# Patient Record
Sex: Male | Born: 1970 | Race: White | Hispanic: No | Smoking: Never smoker
Health system: Southern US, Community
[De-identification: ages and names within clinical notes are randomized; demographics above are authoritative.]

## PROBLEM LIST (undated history)

## (undated) DIAGNOSIS — R030 Elevated blood-pressure reading, without diagnosis of hypertension: Secondary | ICD-10-CM

## (undated) DIAGNOSIS — I493 Ventricular premature depolarization: Secondary | ICD-10-CM

## (undated) HISTORY — DX: Ventricular premature depolarization: I49.3

## (undated) HISTORY — DX: Elevated blood-pressure reading, without diagnosis of hypertension: R03.0

## (undated) HISTORY — PX: NO PAST SURGERIES: SHX2092

---

## 2010-08-31 ENCOUNTER — Emergency Department (HOSPITAL_COMMUNITY): Admission: EM | Admit: 2010-08-31 | Discharge: 2010-09-01 | Payer: Self-pay | Admitting: Emergency Medicine

## 2010-09-03 ENCOUNTER — Encounter (INDEPENDENT_AMBULATORY_CARE_PROVIDER_SITE_OTHER): Payer: Self-pay | Admitting: *Deleted

## 2010-09-03 ENCOUNTER — Emergency Department (HOSPITAL_COMMUNITY): Admission: EM | Admit: 2010-09-03 | Discharge: 2010-09-03 | Payer: Self-pay | Admitting: Emergency Medicine

## 2010-09-21 ENCOUNTER — Encounter (INDEPENDENT_AMBULATORY_CARE_PROVIDER_SITE_OTHER): Payer: Self-pay | Admitting: *Deleted

## 2010-11-06 ENCOUNTER — Ambulatory Visit: Payer: Self-pay | Admitting: Gastroenterology

## 2010-11-06 ENCOUNTER — Encounter (INDEPENDENT_AMBULATORY_CARE_PROVIDER_SITE_OTHER): Payer: Self-pay | Admitting: *Deleted

## 2011-01-29 NOTE — Assessment & Plan Note (Signed)
History of Present Illness Visit Type: Initial Consult Primary GI MD: Rob Bunting MD Primary Provider: Allayne Butcher, MD Requesting Provider: Allayne Butcher, MD Chief Complaint: epigastric pain History of Present Illness:       very pleasant 40 year old man who in september, got home from work, noticed a nagging epigastric pain, dull (about  a 3/10), kept getting more intense.  Felt like he had to pass gas or  belch.  Waxed and waned, he tossed and turned for a couple hours then fell to sleep.   Went to family medicine appt the next day. Felt completely fine.  Thought it was perhaps GERd related, was given omeprazole, take one a day.  Later that same day the pain hit again and so he went to ER at [facility].  They did bloodwork (all normal).  Given some food, feeling bloated, early satiety.  went home, felt indigestion, early satiety (sherbet, crackers, potatos).  Went to work and had the same pain, went back to ER.  Had US done (normal).  Had plain abd films (normal).  Was given a single percocet and that is all he took and has felt completley fine every since then.  He used to take [facility] powders, stopped 5 years.  Still needs ibuprofen about 2 times a month.           Current Medications (verified): 1)  Omeprazole 20 Mg Cpdr (Omeprazole) .... Once Daily  Allergies (verified): No Known Drug Allergies  Past History:  Past Medical History: none  Past Surgical History: none  Family History: no colon cancer, no stomach cancer  Social History: he is single, he has no children, he is an Personnel officer, he does not smoke cigarettes or drink alcohol or caffeine  Review of Systems       Pertinent positive and negative review of systems were noted in the above HPI and GI specific review of systems.  All other review of systems was otherwise negative.   Vital Signs:  Patient profile:   40 year old male Height:      65 inches Weight:      155.13 pounds BMI:     25.91 Pulse rate:   100 /  minute Pulse rhythm:   regular BP sitting:   118 / 70  (left arm) Cuff size:   regular  Vitals Entered By: June McMurray CMA Duncan Dull) (November 06, 2010 10:16 AM)  Physical Exam  Additional Exam:  Constitutional: generally well appearing Psychiatric: alert and oriented times 3 Eyes: extraocular movements intact Mouth: oropharynx moist, no lesions Neck: supple, no lymphadenopathy Cardiovascular: heart regular rate and rythm Lungs: CTA bilaterally Abdomen: soft, non-tender, non-distended, no obvious ascites, no peritoneal signs, normal bowel sounds Extremities: no lower extremity edema bilaterally Skin: no lesions on visible extremities    Impression & Recommendations:  Problem # 1:  Limited dyspepsia CBC, complete metabolic profile, abdominal ultrasound, abdominal plain films were all normal during this episode of dyspepsia about 2 months ago. He has felt completely normal since then. Perhaps he had a viral mild gastroenteritis. Perhaps this was indeed biliary in nature, early or dyskinesia perhaps. He doesn't take a lot of NSAIDs to suggest peptic ulcer disease. Also of note is that he was H. pylori negative serologically.  Given a negative workup to date and no alarm symptoms I think simply observing him clinically is reasonable. He will try to cut backOn his proton pump inhibitor and then stop altogether in about 2 weeks. He knows to call  if he has a return of his symptoms.  Patient Instructions: 1)  Reduce the omeprazole to every other day for two weeks, then stop completely.  Call Dr. Christella Hartigan' in 4-5 weeks to report on your symptoms.   2)  If symptoms return call sooner.  3)  The medication list was reviewed and reconciled.  All changed / newly prescribed medications were explained.  A complete medication list was provided to the patient / caregiver.

## 2011-01-29 NOTE — Letter (Signed)
Summary: Return to Work  [facility]  [address] [place], [place] [address]   Phone: [phone]  Fax: [phone]    11/06/2010  TO: Leodis Sias IT MAY CONCERN  RE: Michael Zavala [address] [name]   The above named individual is under my medical care and may return to work on: 11/06/10    If you have any further questions or need additional information, please call.     Sincerely,      Rob Bunting MD typed by: Chales Abrahams CMA (AAMA)

## 2011-01-29 NOTE — Letter (Signed)
Summary: New Patient letter  [facility]  [address] [place], [place] [address]   Phone: [phone]  Fax: [phone]       09/21/2010 MRN: [id number]  [facility] [address] [place], [place]  [address]  Dear Michael Zavala,  Welcome to the Gastroenterology Division at [facility].    You are scheduled to see Dr.  Christella Hartigan on 11-06-10 at 10:30a.m. on the 3rd floor at [facility], [address]  We ask that you try to arrive at our office 15 minutes prior to your appointment time to allow for check-in.  We would like you to complete the enclosed self-administered evaluation form prior to your visit and bring it with you on the day of your appointment.  We will review it with you.  Also, please bring a complete list of all your medications or, if you prefer, bring the medication bottles and we will list them.  Please bring your insurance card so that we may make a copy of it.  If your insurance requires a referral to see a specialist, please bring your referral form from your primary care physician.  Co-payments are due at the time of your visit and may be paid by cash, check or credit card.     Your office visit will consist of a consult with your physician (includes a physical exam), any laboratory testing he/she may order, scheduling of any necessary diagnostic testing (e.g. x-ray, ultrasound, CT-scan), and scheduling of a procedure (e.g. Endoscopy, Colonoscopy) if required.  Please allow enough time on your schedule to allow for any/all of these possibilities.    If you cannot keep your appointment, please call [phone] to cancel or reschedule prior to your appointment date.  This allows [place] the opportunity to schedule an appointment for another patient in need of care.  If you do not cancel or reschedule by 5 p.m. the business day prior to your appointment date, you will be charged a $50.00 late cancellation/no-show fee.    Thank you for choosing  [facility] for your medical needs.  We appreciate the opportunity to care for you.  Please visit [place] at our website  to learn more about our practice.                     Sincerely,                                                             The Gastroenterology Division

## 2011-03-14 LAB — DIFFERENTIAL
Basophils Absolute: 0 10*3/uL (ref 0.0–0.1)
Eosinophils Relative: 1 % (ref 0–5)
Lymphocytes Relative: 23 % (ref 12–46)
Lymphs Abs: 1.9 10*3/uL (ref 0.7–4.0)
Monocytes Absolute: 0.8 10*3/uL (ref 0.1–1.0)
Neutro Abs: 5.4 10*3/uL (ref 1.7–7.7)

## 2011-03-14 LAB — COMPREHENSIVE METABOLIC PANEL
AST: 19 U/L (ref 0–37)
Albumin: 4.4 g/dL (ref 3.5–5.2)
BUN: 7 mg/dL (ref 6–23)
Calcium: 9.7 mg/dL (ref 8.4–10.5)
Creatinine, Ser: 1.01 mg/dL (ref 0.4–1.5)
GFR calc Af Amer: >60 mL/min (ref >60)
Total Bilirubin: 1 mg/dL (ref 0.3–1.2)
Total Protein: 7.8 g/dL (ref 6.0–8.3)

## 2011-03-14 LAB — CBC
MCH: 31.1 pg (ref 26.0–34.0)
MCHC: 34.3 g/dL (ref 30.0–36.0)
MCV: 90.8 fL (ref 78.0–100.0)
Platelets: 216 10*3/uL (ref 150–400)
RBC: 5.35 MIL/uL (ref 4.22–5.81)
RDW: 12.8 % (ref 11.5–15.5)

## 2011-03-14 LAB — LIPASE, BLOOD: Lipase: 27 U/L (ref 11–59)

## 2011-08-14 IMAGING — CR DG ABDOMEN 2V
2 series · 2 of 2 positions shown · non-contrast
Comparison: None

CLINICAL DATA: Abdominal pain.

ABDOMEN - 2 VIEW

[w abdomen upright]
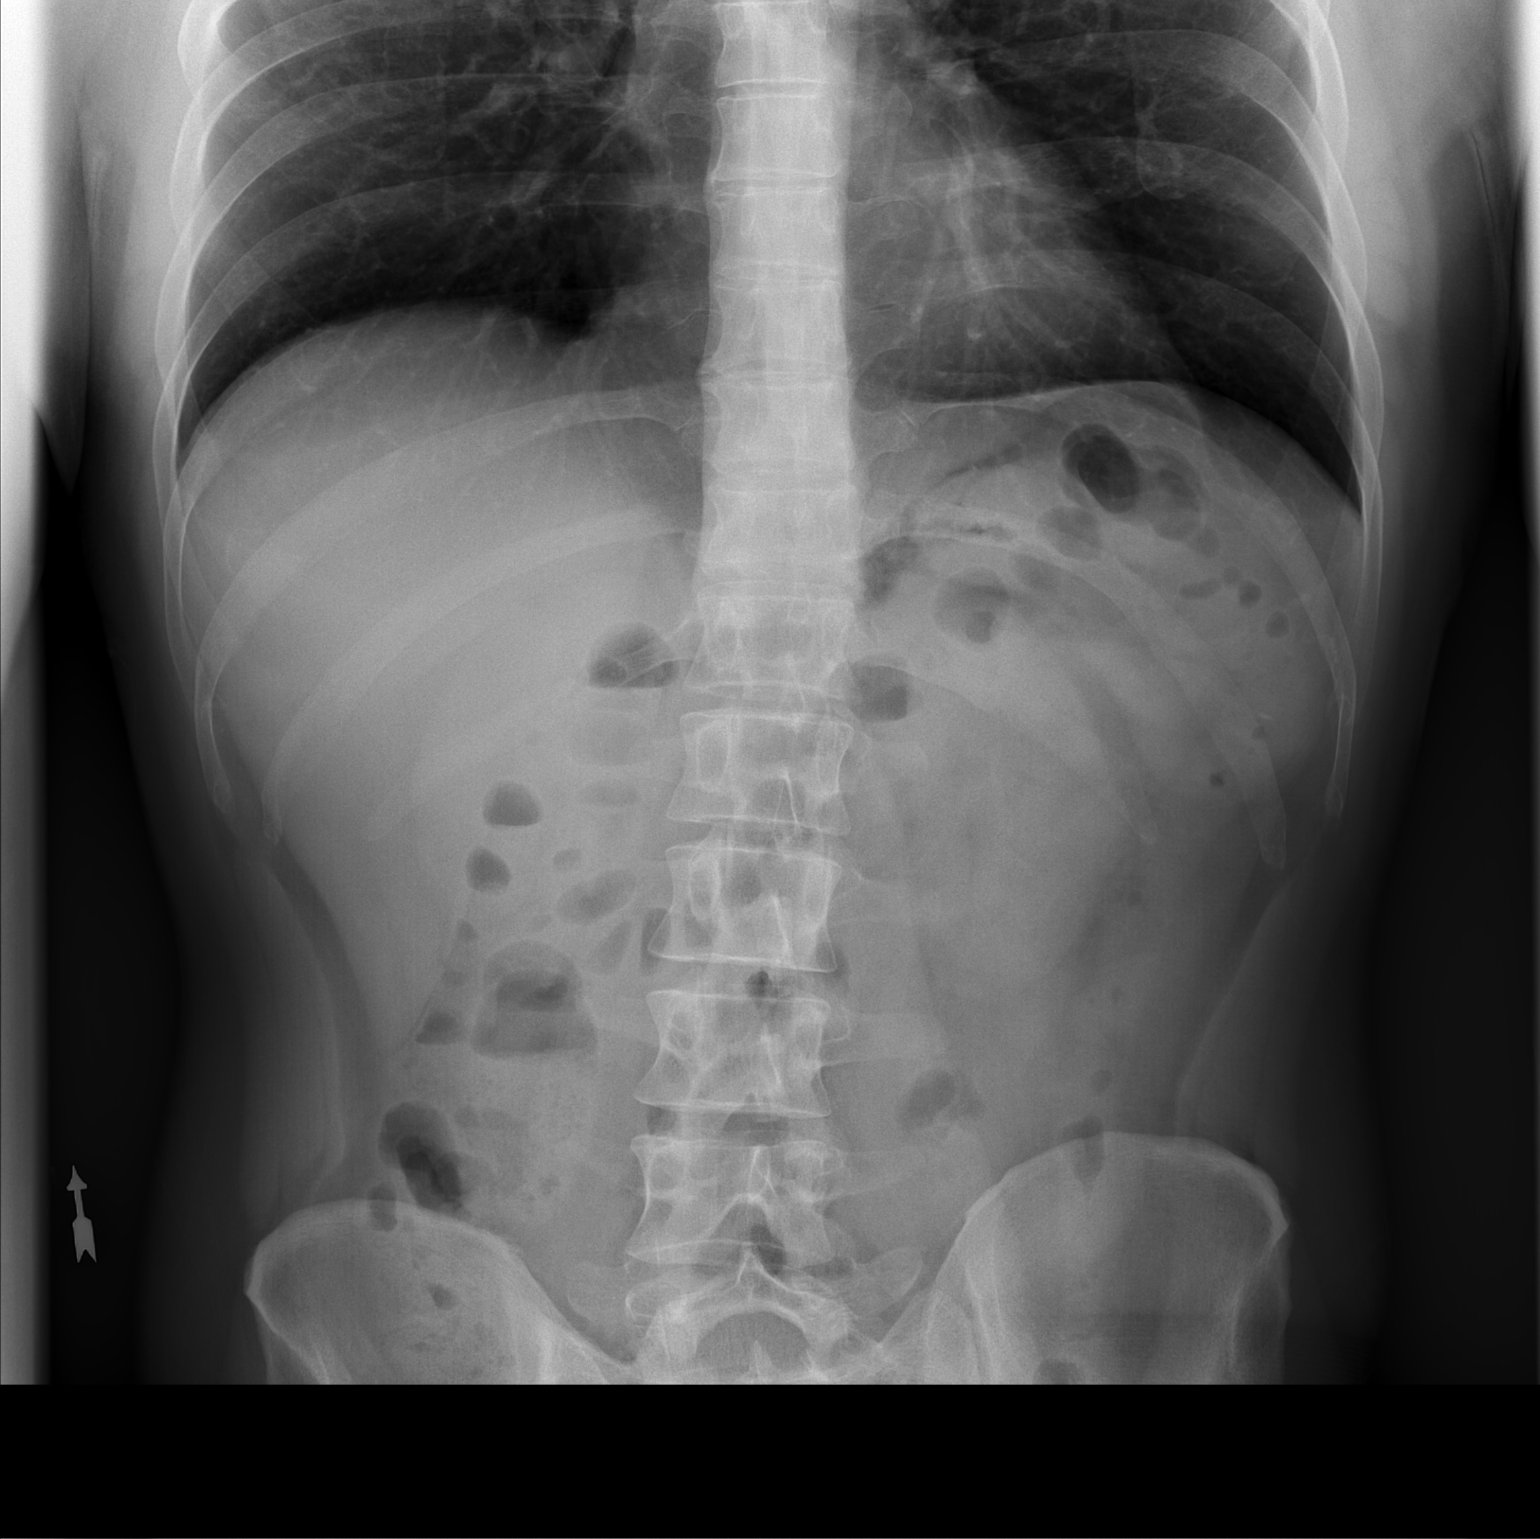

[t abdomen supine]
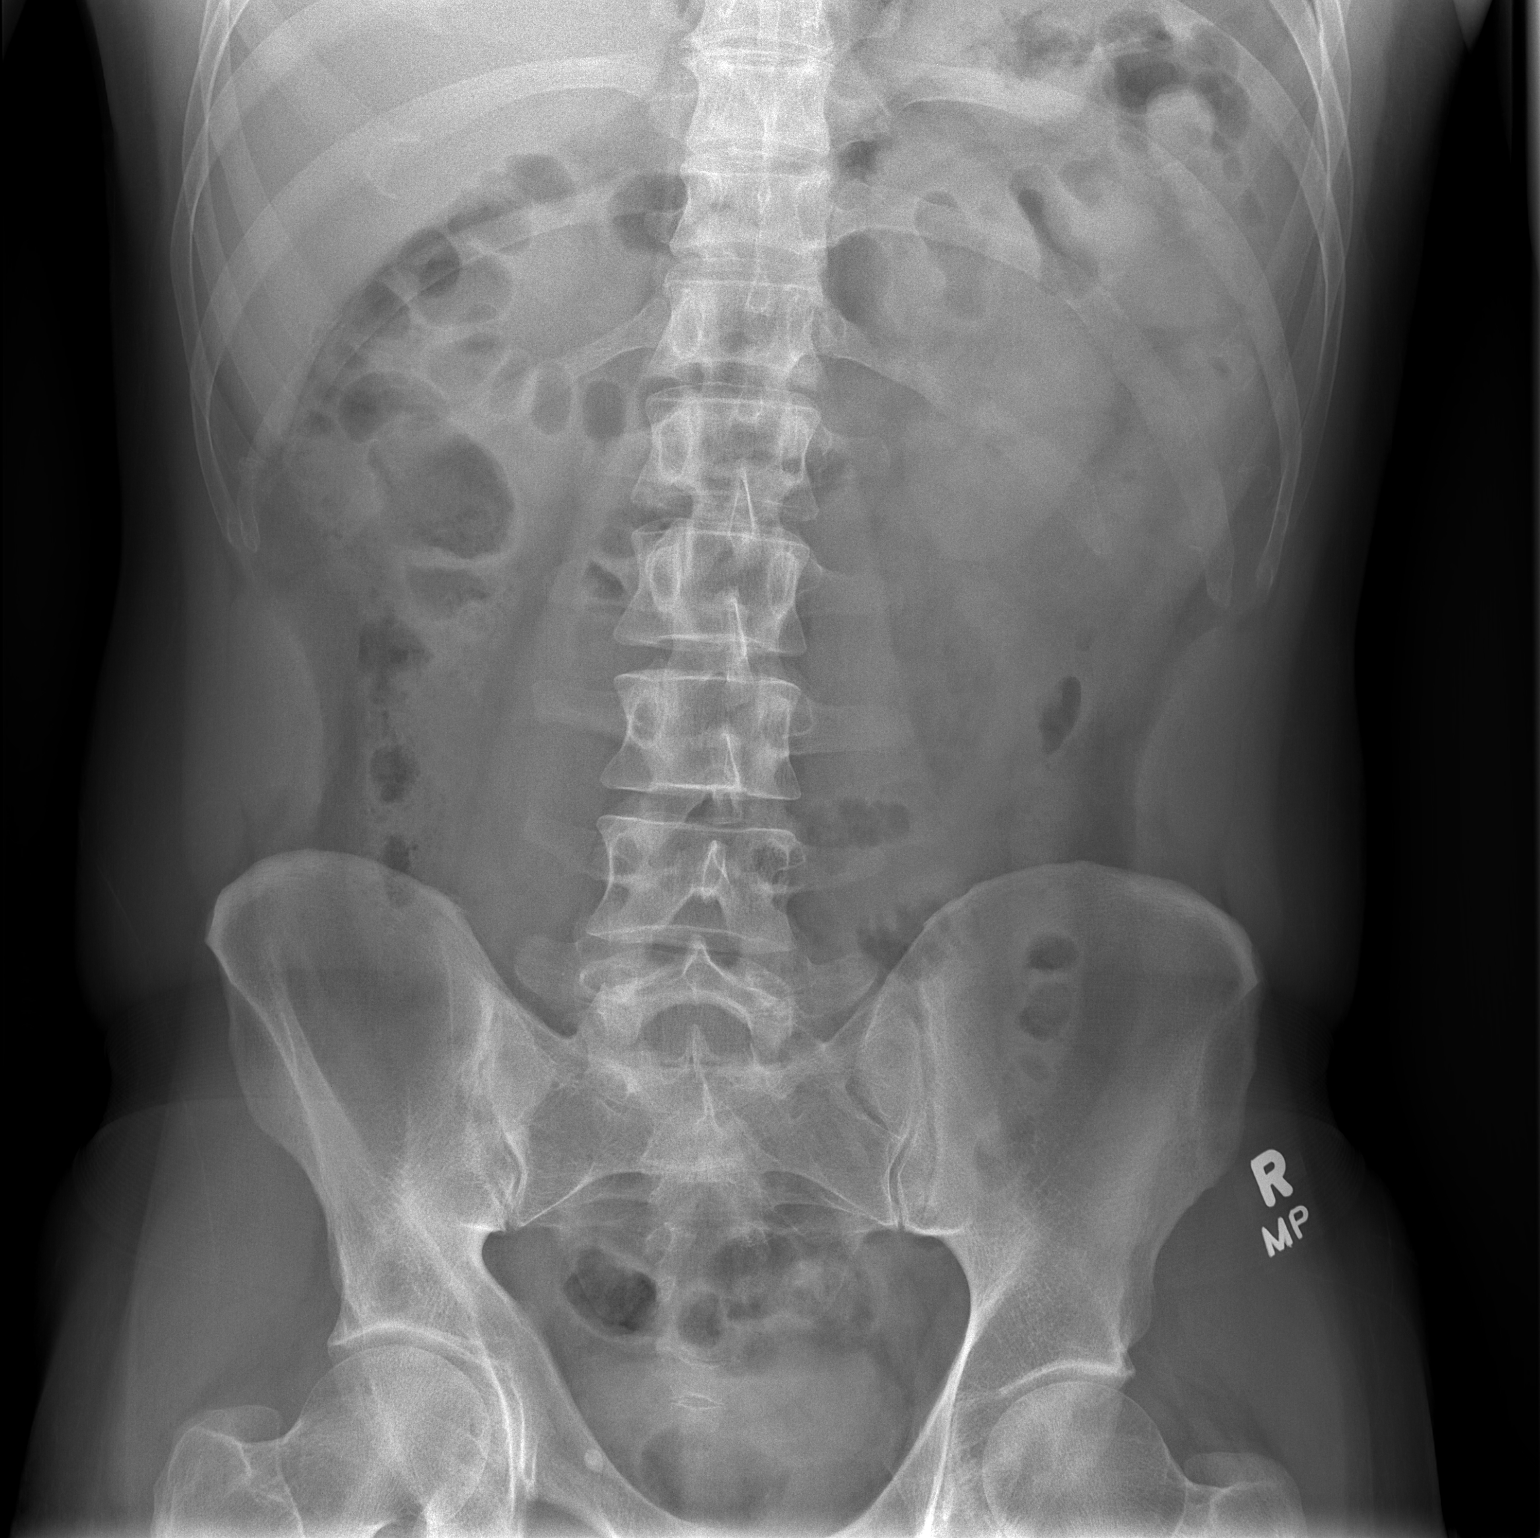

[2 of 2 positions shown; findings below may reference images not displayed]

FINDINGS: Normal bowel gas pattern.  Negative for bowel
obstruction.  Negative for free intraperitoneal gas.  No bony
abnormalities detected.  Negative for renal calculi.
IMPRESSION: Negative

## 2013-06-30 ENCOUNTER — Other Ambulatory Visit: Payer: Self-pay | Admitting: Family Medicine

## 2013-11-03 ENCOUNTER — Other Ambulatory Visit: Payer: 59

## 2013-11-03 DIAGNOSIS — Z0000 Encounter for general adult medical examination without abnormal findings: Secondary | ICD-10-CM

## 2013-11-03 LAB — CBC WITH DIFFERENTIAL/PLATELET
HCT: 46.7 % (ref 39.0–52.0)
Hemoglobin: 15.9 g/dL (ref 13.0–17.0)
Lymphs Abs: 1.8 10*3/uL (ref 0.7–4.0)
MCH: 30.4 pg (ref 26.0–34.0)
MCHC: 34 g/dL (ref 30.0–36.0)
Monocytes Absolute: 0.4 10*3/uL (ref 0.1–1.0)
Monocytes Relative: 9 % (ref 3–12)
Neutro Abs: 2.1 10*3/uL (ref 1.7–7.7)
Neutrophils Relative %: 46 % (ref 43–77)
RBC: 5.23 MIL/uL (ref 4.22–5.81)

## 2013-11-03 LAB — COMPREHENSIVE METABOLIC PANEL
Albumin: 4.3 g/dL (ref 3.5–5.2)
BUN: 19 mg/dL (ref 6–23)
CO2: 30 mEq/L (ref 19–32)
Calcium: 9.7 mg/dL (ref 8.4–10.5)
Glucose, Bld: 79 mg/dL (ref 70–99)
Potassium: 4.8 mEq/L (ref 3.5–5.3)
Sodium: 140 mEq/L (ref 135–145)
Total Protein: 7.1 g/dL (ref 6.0–8.3)

## 2013-11-03 LAB — LIPID PANEL
Cholesterol: 150 mg/dL (ref 0–200)
HDL: 44 mg/dL (ref >39)
Triglycerides: 76 mg/dL (ref <150)

## 2013-11-16 ENCOUNTER — Encounter: Payer: Self-pay | Admitting: Family Medicine

## 2013-11-16 ENCOUNTER — Ambulatory Visit (INDEPENDENT_AMBULATORY_CARE_PROVIDER_SITE_OTHER): Payer: 59 | Admitting: Family Medicine

## 2013-11-16 VITALS — BP 110/78 | HR 100 | Temp 97.5°F | Resp 14 | Ht 65.0 in | Wt 144.0 lb

## 2013-11-16 DIAGNOSIS — Z0000 Encounter for general adult medical examination without abnormal findings: Secondary | ICD-10-CM

## 2013-11-16 DIAGNOSIS — I493 Ventricular premature depolarization: Secondary | ICD-10-CM | POA: Insufficient documentation

## 2013-11-16 MED ORDER — FLUTICASONE PROPIONATE 50 MCG/ACT NA SUSP: 2.0000 | Freq: Every day | NASAL | Status: DC

## 2013-11-16 NOTE — Progress Notes (Signed)
Subjective:    Patient ID: Michael Zavala, male    DOB: 1971/08/29, 42 y.o.   MRN: [id number]  HPI  Patient is here today for complete physical exam. He reports occasional sinus congestion and sinus pressure. Otherwise he is doing well. His most recent labwork as listed below: Lab on 11/03/2013  Component Date Value Range Status  . WBC 11/03/2013 4.4  4.0 - 10.5 K/uL Final  . RBC 11/03/2013 5.23  4.22 - 5.81 MIL/uL Final  . Hemoglobin 11/03/2013 15.9  13.0 - 17.0 g/dL Final  . HCT 28/41/3244 46.7  39.0 - 52.0 % Final  . MCV 11/03/2013 89.3  78.0 - 100.0 fL Final  . MCH 11/03/2013 30.4  26.0 - 34.0 pg Final  . MCHC 11/03/2013 34.0  30.0 - 36.0 g/dL Final  . RDW 01/01/7252 13.9  11.5 - 15.5 % Final  . Platelets 11/03/2013 192  150 - 400 K/uL Final  . Neutrophils Relative % 11/03/2013 46  43 - 77 % Final  . Neutro Abs 11/03/2013 2.1  1.7 - 7.7 K/uL Final  . Lymphocytes Relative 11/03/2013 40  12 - 46 % Final  . Lymphs Abs 11/03/2013 1.8  0.7 - 4.0 K/uL Final  . Monocytes Relative 11/03/2013 9  3 - 12 % Final  . Monocytes Absolute 11/03/2013 0.4  0.1 - 1.0 K/uL Final  . Eosinophils Relative 11/03/2013 5  0 - 5 % Final  . Eosinophils Absolute 11/03/2013 0.2  0.0 - 0.7 K/uL Final  . Basophils Relative 11/03/2013 0  0 - 1 % Final  . Basophils Absolute 11/03/2013 0.0  0.0 - 0.1 K/uL Final  . Smear Review 11/03/2013 Criteria for review not met   Final  . Sodium 11/03/2013 140  135 - 145 mEq/L Final  . Potassium 11/03/2013 4.8  3.5 - 5.3 mEq/L Final  . Chloride 11/03/2013 102  96 - 112 mEq/L Final  . CO2 11/03/2013 30  19 - 32 mEq/L Final  . Glucose, Bld 11/03/2013 79  70 - 99 mg/dL Final  . BUN 66/44/0347 19  6 - 23 mg/dL Final  . Creat 42/59/5638 0.99  0.50 - 1.35 mg/dL Final  . Total Bilirubin 11/03/2013 1.3* 0.3 - 1.2 mg/dL Final  . Alkaline Phosphatase 11/03/2013 45  39 - 117 U/L Final  . AST 11/03/2013 23  0 - 37 U/L Final  . ALT 11/03/2013 41  0 - 53 U/L Final  . Total Protein  11/03/2013 7.1  6.0 - 8.3 g/dL Final  . Albumin 75/64/3329 4.3  3.5 - 5.2 g/dL Final  . Calcium 51/88/4166 9.7  8.4 - 10.5 mg/dL Final  . Cholesterol 06/28/1600 150  0 - 200 mg/dL Final   Comment: ATP III Classification:                                < 200        mg/dL        Desirable                               200 - 239     mg/dL        Borderline High                               >= 240  mg/dL        High                             . Triglycerides 11/03/2013 76  <150 mg/dL Final  . HDL 16/09/9603 44  >39 mg/dL Final  . Total CHOL/HDL Ratio 11/03/2013 3.4   Final  . VLDL 11/03/2013 15  0 - 40 mg/dL Final  . LDL Cholesterol 11/03/2013 91  0 - 99 mg/dL Final   Comment:                            Total Cholesterol/HDL Ratio:CHD Risk                                                 Coronary Heart Disease Risk Table                                                                 Men       Women                                   1/2 Average Risk              3.4        3.3                                       Average Risk              5.0        4.4                                    2X Average Risk              9.6        7.1                                    3X Average Risk             23.4       11.0                          Use the calculated Patient Ratio above and the CHD Risk table                           to determine the patient's CHD Risk.                          ATP III Classification (LDL):                                <  100        mg/dL         Optimal                               100 - 129     mg/dL         Near or Above Optimal                               130 - 159     mg/dL         Borderline High                               160 - 189     mg/dL         High                                > 190        mg/dL         Very High                              Past Medical History  Diagnosis Date  . PVC (premature ventricular contraction)    History    Social History  . Marital Status: Divorced    Spouse Name: N/A    Number of Children: N/A  . Years of Education: N/A   Occupational History  . Not on file.   Social History Main Topics  . Smoking status: Never Smoker   . Smokeless tobacco: Not on file  . Alcohol Use: Yes     Comment: Rare  . Drug Use: No  . 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   Other Topics Concern  . Not on file   Social History Narrative  . No narrative on file   Family History  Problem Relation Age of Onset  . Heart disease Father 59  . Heart disease Sister 45     Review of Systems  All other systems reviewed and are negative.       Objective:   Physical Exam  Vitals reviewed. Constitutional: He is oriented to person, place, and time. He appears well-developed and well-nourished. No distress.  HENT:  Head: Normocephalic and atraumatic.  Right Ear: External ear normal.  Left Ear: External ear normal.  Nose: Nose normal.  Mouth/Throat: Oropharynx is clear and moist. No oropharyngeal exudate.  Eyes: Conjunctivae and EOM are normal. Pupils are equal, round, and reactive to light. Right eye exhibits no discharge. Left eye exhibits no discharge. No scleral icterus.  Neck: Normal range of motion. Neck supple. No JVD present. No thyromegaly present.  Cardiovascular: Normal rate and regular rhythm.  Exam reveals no gallop and no friction rub.   No murmur heard. Pulmonary/Chest: Effort normal and breath sounds normal. No respiratory distress. He has no wheezes. He has no rales. He exhibits no tenderness.  Abdominal: Soft. Bowel sounds are normal. He exhibits no distension and no mass. There is no tenderness. There is no rebound and no guarding.  Genitourinary: Penis normal. Guaiac negative stool. No penile tenderness.  Musculoskeletal: Normal range of motion. He exhibits no edema  and no tenderness.  Lymphadenopathy:    He has no cervical adenopathy.  Neurological: He  is alert and oriented to person, place, and time. He has normal reflexes. He displays normal reflexes. No cranial nerve deficit. He exhibits normal muscle tone. Coordination normal.  Skin: Skin is warm. No rash noted. He is not diaphoretic. No erythema. No pallor.  Psychiatric: He has a normal mood and affect. His behavior is normal. Judgment and thought content normal.          Assessment & Plan:  1. Routine general medical examination at a health care facility Labs were within normal. Physical exam is completely normal. Cancer screening is up to date. I recommended a flu shot and aTdaP and patient declines both. Recheck in one year. I did give the patient prescription for Flonase for his nasal congestion. Also described on the tapering off Toprol patient like to discontinue medication if he is no longer having PVCs.Marland Kitchen

## 2013-11-18 ENCOUNTER — Encounter: Payer: Self-pay | Admitting: Family Medicine

## 2013-12-29 ENCOUNTER — Other Ambulatory Visit: Payer: Self-pay | Admitting: Family Medicine

## 2014-06-30 ENCOUNTER — Other Ambulatory Visit: Payer: Self-pay | Admitting: Family Medicine

## 2014-12-27 ENCOUNTER — Other Ambulatory Visit: Payer: Self-pay | Admitting: Family Medicine

## 2014-12-27 ENCOUNTER — Encounter: Payer: Self-pay | Admitting: Family Medicine

## 2014-12-27 NOTE — Telephone Encounter (Signed)
Medication refill for one time only.  Patient needs to be seen.  Letter sent for patient to call and schedule.  Pt has not been seen in over 1 year. 

## 2015-01-26 ENCOUNTER — Other Ambulatory Visit: Payer: Self-pay | Admitting: Family Medicine

## 2015-01-26 NOTE — Telephone Encounter (Signed)
Pt has not been seen in over 1 year.  Has been notified appt needed.  Refills denied

## 2015-11-07 ENCOUNTER — Other Ambulatory Visit: Payer: 59

## 2015-11-07 DIAGNOSIS — Z0000 Encounter for general adult medical examination without abnormal findings: Secondary | ICD-10-CM

## 2015-11-07 LAB — CBC WITH DIFFERENTIAL/PLATELET
BASOS ABS: 0 10*3/uL (ref 0.0–0.1)
Basophils Relative: 0 % (ref 0–1)
EOS ABS: 0.2 10*3/uL (ref 0.0–0.7)
Eosinophils Relative: 5 % (ref 0–5)
HEMATOCRIT: 46.3 % (ref 39.0–52.0)
Hemoglobin: 16 g/dL (ref 13.0–17.0)
Lymphocytes Relative: 35 % (ref 12–46)
Lymphs Abs: 1.6 10*3/uL (ref 0.7–4.0)
MCH: 30.5 pg (ref 26.0–34.0)
MCHC: 34.6 g/dL (ref 30.0–36.0)
MCV: 88.2 fL (ref 78.0–100.0)
MONO ABS: 0.3 10*3/uL (ref 0.1–1.0)
MONOS PCT: 7 % (ref 3–12)
MPV: 9 fL (ref 8.6–12.4)
NEUTROS ABS: 2.4 10*3/uL (ref 1.7–7.7)
Neutrophils Relative %: 53 % (ref 43–77)
PLATELETS: 164 10*3/uL (ref 150–400)
RBC: 5.25 MIL/uL (ref 4.22–5.81)
RDW: 13.7 % (ref 11.5–15.5)
WBC: 4.5 10*3/uL (ref 4.0–10.5)

## 2015-11-07 LAB — COMPLETE METABOLIC PANEL WITH GFR
ALT: 24 U/L (ref 9–46)
AST: 20 U/L (ref 10–40)
Albumin: 4.4 g/dL (ref 3.6–5.1)
Alkaline Phosphatase: 39 U/L — ABNORMAL LOW (ref 40–115)
BILIRUBIN TOTAL: 0.9 mg/dL (ref 0.2–1.2)
BUN: 17 mg/dL (ref 7–25)
CHLORIDE: 102 mmol/L (ref 98–110)
CO2: 28 mmol/L (ref 20–31)
CREATININE: 0.89 mg/dL (ref 0.60–1.35)
Calcium: 9.7 mg/dL (ref 8.6–10.3)
GFR, Est African American: >89 mL/min (ref >=60)
GFR, Est Non African American: >89 mL/min (ref >=60)
GLUCOSE: 87 mg/dL (ref 70–99)
Potassium: 4.5 mmol/L (ref 3.5–5.3)
Sodium: 139 mmol/L (ref 135–146)
TOTAL PROTEIN: 7.2 g/dL (ref 6.1–8.1)

## 2015-11-07 LAB — LIPID PANEL
Cholesterol: 156 mg/dL (ref 125–200)
HDL: 46 mg/dL (ref >=40)
LDL CALC: 97 mg/dL (ref <130)
TRIGLYCERIDES: 67 mg/dL (ref <150)
Total CHOL/HDL Ratio: 3.4 Ratio (ref <=5.0)
VLDL: 13 mg/dL (ref <30)

## 2015-11-21 ENCOUNTER — Encounter: Payer: Self-pay | Admitting: Family Medicine

## 2015-11-21 ENCOUNTER — Ambulatory Visit (INDEPENDENT_AMBULATORY_CARE_PROVIDER_SITE_OTHER): Payer: 59 | Admitting: Family Medicine

## 2015-11-21 VITALS — BP 130/80 | HR 130 | Temp 98.1°F | Resp 14 | Ht 65.0 in | Wt 145.0 lb

## 2015-11-21 DIAGNOSIS — R000 Tachycardia, unspecified: Secondary | ICD-10-CM

## 2015-11-21 DIAGNOSIS — Z0000 Encounter for general adult medical examination without abnormal findings: Secondary | ICD-10-CM

## 2015-11-21 LAB — TSH: TSH: 2.408 u[IU]/mL (ref 0.350–4.500)

## 2015-11-21 MED ORDER — MOMETASONE FUROATE 0.1 % EX CREA: 1.0000 "application " | TOPICAL_CREAM | Freq: Every day | CUTANEOUS | Status: DC

## 2015-11-21 NOTE — Progress Notes (Signed)
Subjective:    Patient ID: Michael Zavala, male    DOB: Aug 25, 1971, 44 y.o.   MRN: [id number]  HPI Patient is here today for complete physical exam. However on his vital signs, his heart rate is 130 bpm. I checked this myself and found to be close to 120 bpm. It is regular. EKG today perform shows sinus tachycardia. However by the time his EKG performed, his heart rate had slowed to 108 bpm. There are nonspecific ST changes but no evidence of ischemia or infarction or other cardiac arrhythmias. The patient missed that he is extremely nervous in the doctor's office. Otherwise he has been doing well and he is completely asymptomatic. His most recent lab work as listed below: Lab on 11/07/2015  Component Date Value Ref Range Status  . Cholesterol 11/07/2015 156  125 - 200 mg/dL Final  . Triglycerides 11/07/2015 67  <150 mg/dL Final  . HDL 11/07/2015 46  >=40 mg/dL Final  . Total CHOL/HDL Ratio 11/07/2015 3.4  <=5.0 Ratio Final  . VLDL 11/07/2015 13  <30 mg/dL Final  . LDL Cholesterol 11/07/2015 97  <130 mg/dL Final   Comment:   Total Cholesterol/HDL Ratio:CHD Risk                        Coronary Heart Disease Risk Table                                        Men       Women          1/2 Average Risk              3.4        3.3              Average Risk              5.0        4.4           2X Average Risk              9.6        7.1           3X Average Risk             23.4       11.0 Use the calculated Patient Ratio above and the CHD Risk table  to determine the patient's CHD Risk.   . WBC 11/07/2015 4.5  4.0 - 10.5 K/uL Final  . RBC 11/07/2015 5.25  4.22 - 5.81 MIL/uL Final  . Hemoglobin 11/07/2015 16.0  13.0 - 17.0 g/dL Final  . HCT 11/07/2015 46.3  39.0 - 52.0 % Final  . MCV 11/07/2015 88.2  78.0 - 100.0 fL Final  . MCH 11/07/2015 30.5  26.0 - 34.0 pg Final  . MCHC 11/07/2015 34.6  30.0 - 36.0 g/dL Final  . RDW 11/07/2015 13.7  11.5 - 15.5 % Final  . Platelets 11/07/2015 164  150 -  400 K/uL Final  . MPV 11/07/2015 9.0  8.6 - 12.4 fL Final  . Neutrophils Relative % 11/07/2015 53  43 - 77 % Final  . Neutro Abs 11/07/2015 2.4  1.7 - 7.7 K/uL Final  . Lymphocytes Relative 11/07/2015 35  12 - 46 % Final  . Lymphs Abs 11/07/2015 1.6  0.7 - 4.0 K/uL Final  . Monocytes Relative  11/07/2015 7  3 - 12 % Final  . Monocytes Absolute 11/07/2015 0.3  0.1 - 1.0 K/uL Final  . Eosinophils Relative 11/07/2015 5  0 - 5 % Final  . Eosinophils Absolute 11/07/2015 0.2  0.0 - 0.7 K/uL Final  . Basophils Relative 11/07/2015 0  0 - 1 % Final  . Basophils Absolute 11/07/2015 0.0  0.0 - 0.1 K/uL Final  . Smear Review 11/07/2015 Criteria for review not met   Final  . Sodium 11/07/2015 139  135 - 146 mmol/L Final  . Potassium 11/07/2015 4.5  3.5 - 5.3 mmol/L Final  . Chloride 11/07/2015 102  98 - 110 mmol/L Final  . CO2 11/07/2015 28  20 - 31 mmol/L Final  . Glucose, Bld 11/07/2015 87  70 - 99 mg/dL Final  . BUN 11/07/2015 17  7 - 25 mg/dL Final  . Creat 11/07/2015 0.89  0.60 - 1.35 mg/dL Final  . Total Bilirubin 11/07/2015 0.9  0.2 - 1.2 mg/dL Final  . Alkaline Phosphatase 11/07/2015 39* 40 - 115 U/L Final  . AST 11/07/2015 20  10 - 40 U/L Final  . ALT 11/07/2015 24  9 - 46 U/L Final  . Total Protein 11/07/2015 7.2  6.1 - 8.1 g/dL Final  . Albumin 11/07/2015 4.4  3.6 - 5.1 g/dL Final  . Calcium 11/07/2015 9.7  8.6 - 10.3 mg/dL Final  . GFR, Est African American 11/07/2015 >89  >=60 mL/min Final  . GFR, Est Non African American 11/07/2015 >89  >=60 mL/min Final   Comment:   The estimated GFR is a calculation valid for adults (>=53 years old) that uses the CKD-EPI algorithm to adjust for age and sex. It is   not to be used for children, pregnant women, hospitalized patients,    patients on dialysis, or with rapidly changing kidney function. According to the NKDEP, eGFR >89 is normal, 60-89 shows mild impairment, 30-59 shows moderate impairment, 15-29 shows severe impairment and <15 is  ESRD.      Past Medical History  Diagnosis Date  . PVC (premature ventricular contraction)    No past surgical history on file. Current Outpatient Prescriptions on File Prior to Visit  Medication Sig Dispense Refill  . fluticasone (FLONASE) 50 MCG/ACT nasal spray PLACE 2 SPRAYS INTO BOTH NOSTRILS DAILY. (Patient not taking: Reported on 11/21/2015) 16 g 11   No current facility-administered medications on file prior to visit.   No Known Allergies Social History   Social History  . Marital Status: Divorced    Spouse Name: N/A  . Number of Children: N/A  . Years of Education: N/A   Occupational History  . Not on file.   Social History Main Topics  . Smoking status: Never Smoker   . Smokeless tobacco: Not on file  . Alcohol Use: Yes     Comment: Rare  . Drug Use: No  . Sexual Activity: Not on file     Comment: works as a Furniture conservator/restorer.   Other Topics Concern  . Not on file   Social History Narrative   Family History  Problem Relation Age of Onset  . Heart disease Father 39  . Heart disease Sister 75      Review of Systems  All other systems reviewed and are negative.      Objective:   Physical Exam  Constitutional: He is oriented to person, place, and time. He appears well-developed and well-nourished. No distress.  HENT:  Head: Normocephalic and atraumatic.  Right  Ear: External ear normal.  Left Ear: External ear normal.  Nose: Nose normal.  Mouth/Throat: Oropharynx is clear and moist. No oropharyngeal exudate.  Eyes: Conjunctivae and EOM are normal. Pupils are equal, round, and reactive to light. Right eye exhibits no discharge. Left eye exhibits no discharge. No scleral icterus.  Neck: Normal range of motion. Neck supple. No JVD present. No tracheal deviation present. No thyromegaly present.  Cardiovascular: Regular rhythm, normal heart sounds and intact distal pulses.  Exam reveals no gallop and no friction rub.   No murmur heard. Pulmonary/Chest: Effort  normal and breath sounds normal. No stridor. No respiratory distress. He has no wheezes. He has no rales. He exhibits no tenderness.  Abdominal: Soft. Bowel sounds are normal. He exhibits no distension and no mass. There is no tenderness. There is no rebound and no guarding. Hernia confirmed negative in the right inguinal area and confirmed negative in the left inguinal area.  Genitourinary: Testes normal and penis normal. Cremasteric reflex is present.  Musculoskeletal: Normal range of motion. He exhibits no edema or tenderness.  Lymphadenopathy:    He has no cervical adenopathy.       Right: No inguinal adenopathy present.       Left: No inguinal adenopathy present.  Neurological: He is alert and oriented to person, place, and time. He has normal reflexes. He displays normal reflexes. No cranial nerve deficit. He exhibits normal muscle tone. Coordination normal.  Skin: Skin is warm. No rash noted. He is not diaphoretic. No erythema. No pallor.  Psychiatric: He has a normal mood and affect. His behavior is normal. Judgment and thought content normal.  Vitals reviewed.         Assessment & Plan:  Rapid heart beat - Plan: EKG 12-Lead  Sinus tachycardia (HCC) - Plan: TSH  Routine general medical examination at a health care facility  Physical exam is completely normal except for sinus tachycardia which I believe is due to anxiety of being at the doctor's office. I will check a TSH to verify the patient does not have hyperthyroidism. The longer the patient sat in the office this lower his heart rate became as he calmed down. Otherwise the remainder of his physical exam is normal. His lab work is excellent. Regular anticipatory guidance is provided. Patient declined a flu shot

## 2016-04-30 ENCOUNTER — Telehealth: Payer: Self-pay | Admitting: Family Medicine

## 2016-04-30 DIAGNOSIS — H6123 Impacted cerumen, bilateral: Secondary | ICD-10-CM

## 2016-04-30 NOTE — Telephone Encounter (Signed)
Patient would like call back regarding getting a specialist to look at his ears, would like to get them cleaned out  [phone]

## 2016-05-06 NOTE — Telephone Encounter (Signed)
Called patient.  He says he just feels ears need to be cleaned out.  Told him we can do that here.  He says he prefers to see an Ear Specialist.  Referral initiated

## 2016-05-31 DIAGNOSIS — J309 Allergic rhinitis, unspecified: Secondary | ICD-10-CM | POA: Insufficient documentation

## 2016-05-31 DIAGNOSIS — J342 Deviated nasal septum: Secondary | ICD-10-CM | POA: Insufficient documentation

## 2016-11-14 ENCOUNTER — Ambulatory Visit (INDEPENDENT_AMBULATORY_CARE_PROVIDER_SITE_OTHER): Payer: 59 | Admitting: Physician Assistant

## 2016-11-14 ENCOUNTER — Encounter: Payer: Self-pay | Admitting: Family Medicine

## 2016-11-14 ENCOUNTER — Encounter: Payer: Self-pay | Admitting: Physician Assistant

## 2016-11-14 VITALS — BP 118/78 | HR 104 | Temp 99.1°F | Resp 16 | Wt 148.0 lb

## 2016-11-14 DIAGNOSIS — B9689 Other specified bacterial agents as the cause of diseases classified elsewhere: Principal | ICD-10-CM

## 2016-11-14 DIAGNOSIS — J988 Other specified respiratory disorders: Secondary | ICD-10-CM

## 2016-11-14 MED ORDER — AZITHROMYCIN 250 MG PO TABS: ORAL_TABLET | ORAL | 0 refills | Status: DC

## 2016-11-14 MED ORDER — BENZONATATE 200 MG PO CAPS: 200.0000 mg | ORAL_CAPSULE | Freq: Two times a day (BID) | ORAL | 0 refills | Status: DC | PRN

## 2016-11-14 NOTE — Progress Notes (Signed)
    Patient ID: Michael Zavala MRN: [id number], DOB: 03/29/1971, 45 y.o. Date of Encounter: 11/14/2016, 11:24 AM    Chief Complaint:  Chief Complaint  Patient presents with  . Cough    x 2 weeks  coughing up mucus     HPI: 45 y.o. year old male presents with above.   Says that he recently was in [place] for a month with his work. Says that the whole time he was there seemed like every building was kind of warm--says "they don't believe an air condition over there". Says that the whole time he seemed to have a little bit of a sore throat and a little bit of a cough here and there. Says that he has been back for about 1-2 weeks. Has continued to have some lingering cough that just won't go away. Has gone through a whole bottle of NyQuil take well and is now on Robitussin. Has had minimal nasal mucous. Has had no fevers or chills.     Home Meds:   Outpatient Medications Prior to Visit  Medication Sig Dispense Refill  . mometasone (ELOCON) 0.1 % cream Apply 1 application topically daily. 45 g 0  . Omega-3 Fatty Acids (FISH OIL) 500 MG CAPS Take 2 capsules by mouth 2 (two) times daily.    . fluticasone (FLONASE) 50 MCG/ACT nasal spray PLACE 2 SPRAYS INTO BOTH NOSTRILS DAILY. (Patient not taking: Reported on 11/14/2016) 16 g 11   No facility-administered medications prior to visit.     Allergies: No Known Allergies    Review of Systems: See HPI for pertinent ROS. All other ROS negative.    Physical Exam: Blood pressure 118/78, 86-pulse, temperature 99.1 F (37.3 C), temperature source Oral, resp. rate 16, weight 148 lb (67.1 kg), SpO2 98 %., Body mass index is 24.63 kg/m. General:  WNWD WM. Appears in no acute distress. HEENT: Normocephalic, atraumatic, eyes without discharge, sclera non-icteric, nares are without discharge. Bilateral auditory canals clear, TM's are without perforation, pearly grey and translucent with reflective cone of light bilaterally. Oral cavity moist, posterior  pharynx without exudate, erythema, peritonsillar abscess.  Neck: Supple. No thyromegaly. No lymphadenopathy. Lungs: Clear bilaterally to auscultation without wheezes, rales, or rhonchi. Breathing is unlabored. Heart: Regular rhythm. No murmurs, rubs, or gallops. Msk:  Strength and tone normal for age. Extremities/Skin: Warm and dry. Neuro: Alert and oriented X 3. Moves all extremities spontaneously. Gait is normal. CNII-XII grossly in tact. Psych:  Responds to questions appropriately with a normal affect.     ASSESSMENT AND PLAN:  45 y.o. year old male with  1. Bacterial respiratory infection He is to take the azithromycin as directed. He can also use the Tessalon as needed for cough suppressant. He is to follow-up if symptoms do not resolve within 1 week after completion of antibiotic. - azithromycin (ZITHROMAX) 250 MG tablet; Day 1: Take 2 daily. Days 2 - 5: take 1 daily.  Dispense: 6 tablet; Refill: 0 - benzonatate (TESSALON) 200 MG capsule; Take 1 capsule (200 mg total) by mouth 2 (two) times daily as needed for cough.  Dispense: 20 capsule; Refill: 0   Signed, [address] Beth Hull[place]ixon, [place], [facility] 11/14/2016 11:24 AM

## 2016-11-19 ENCOUNTER — Encounter: Payer: Self-pay | Admitting: Physician Assistant

## 2016-11-19 ENCOUNTER — Encounter: Payer: Self-pay | Admitting: Family Medicine

## 2016-11-19 ENCOUNTER — Ambulatory Visit (INDEPENDENT_AMBULATORY_CARE_PROVIDER_SITE_OTHER): Payer: 59 | Admitting: Physician Assistant

## 2016-11-19 ENCOUNTER — Ambulatory Visit
Admission: RE | Admit: 2016-11-19 | Discharge: 2016-11-19 | Disposition: A | Discharge status: Home / Self Care | Payer: 59 | Source: Ambulatory Visit | Attending: Physician Assistant | Admitting: Physician Assistant

## 2016-11-19 VITALS — BP 120/74 | HR 105 | Temp 98.3°F | Resp 16 | Wt 148.0 lb

## 2016-11-19 DIAGNOSIS — R059 Cough, unspecified: Secondary | ICD-10-CM

## 2016-11-19 DIAGNOSIS — R05 Cough: Secondary | ICD-10-CM | POA: Diagnosis not present

## 2016-11-19 MED ORDER — HYDROCODONE-HOMATROPINE 5-1.5 MG/5ML PO SYRP
5.0000 mL | ORAL_SOLUTION | Freq: Three times a day (TID) | ORAL | 0 refills | Status: DC | PRN
Start: 2016-11-19 — End: 2018-01-12

## 2016-11-19 MED ORDER — CETIRIZINE HCL 10 MG PO TABS: 10.0000 mg | ORAL_TABLET | Freq: Every day | ORAL | 11 refills | Status: DC

## 2016-11-19 MED ORDER — OMEPRAZOLE 20 MG PO CPDR: 20.0000 mg | DELAYED_RELEASE_CAPSULE | Freq: Every day | ORAL | 3 refills | Status: DC

## 2016-11-19 NOTE — Progress Notes (Signed)
Patient ID: Jonelle SportsSteven Grimes MRN: [id number], DOB: 10/19/1971, 45 y.o. Date of Encounter: 11/19/2016, 10:14 AM    Chief Complaint:  Chief Complaint  Patient presents with  . Cough    dry, hoarse     HPI: 45 y.o. year old male presents with above.   11/14/2016: Says that he recently was in [place] for a month with his work. Says that the whole time he was there seemed like every building was kind of warm--says "they don't believe an air condition over there". Says that the whole time he seemed to have a little bit of a sore throat and a little bit of a cough here and there. Says that he has been back for about 1-2 weeks. Has continued to have some lingering cough that just won't go away. Has gone through a whole bottle of NyQuil take well and is now on Robitussin. Has had minimal nasal mucous. Has had no fevers or chills.  ASSESSMENT AND PLAN:  45 y.o. year old male with  1. Bacterial respiratory infection He is to take the azithromycin as directed. He can also use the Tessalon as needed for cough suppressant. He is to follow-up if symptoms do not resolve within 1 week after completion of antibiotic. - azithromycin (ZITHROMAX) 250 MG tablet; Day 1: Take 2 daily. Days 2 - 5: take 1 daily.  Dispense: 6 tablet; Refill: 0 - benzonatate (TESSALON) 200 MG capsule; Take 1 capsule (200 mg total) by mouth 2 (two) times daily as needed for cough.  Dispense: 20 capsule; Refill: 0   11/19/2016: Today he reports that he took the Z-Pak as directed. Says that he "feels like that worked" and "the mucus buildup-- that seems like it resolved".  However, he says that [facility] Is wanting a different medicine to use as a cough suppressant. Says that now he is still having a cough that is dry and he is still clearing his throat a lot. Says that his mom dad and sister all take medicines for GERD. However, he "doesn't feel like he is having any burning sensation so [he's] not sure  that that would be what is causing his symptoms." Says that he doesn't feel bad. Says that he just has this annoying dry cough and clearing his throat.  Has had no fevers or chills. No other complaints or concerns. No sore throat.    Home Meds:   Outpatient Medications Prior to Visit  Medication Sig Dispense Refill  . benzonatate (TESSALON) 200 MG capsule Take 1 capsule (200 mg total) by mouth 2 (two) times daily as needed for cough. 20 capsule 0  . mometasone (ELOCON) 0.1 % cream Apply 1 application topically daily. 45 g 0  . Omega-3 Fatty Acids (FISH OIL) 500 MG CAPS Take 2 capsules by mouth 2 (two) times daily.    Marland Kitchen. azithromycin (ZITHROMAX) 250 MG tablet Day 1: Take 2 daily. Days 2 - 5: take 1 daily. (Patient not taking: Reported on 11/19/2016) 6 tablet 0  . fluticasone (FLONASE) 50 MCG/ACT nasal spray PLACE 2 SPRAYS INTO BOTH NOSTRILS DAILY. (Patient not taking: Reported on 11/19/2016) 16 g 11   No facility-administered medications prior to visit.     Allergies: No Known Allergies    Review of Systems: See HPI for pertinent ROS. All other ROS negative.    Physical Exam: Blood pressure 118/78, 86-pulse, temperature 99.1 F (37.3 C), temperature source Oral, resp. rate 16, weight 148 lb (67.1 kg),  SpO2 98 %., Body mass index is 24.63 kg/m. General:  WNWD WM. Appears in no acute distress. HEENT: Normocephalic, atraumatic, eyes without discharge, sclera non-icteric, nares are without discharge. Bilateral auditory canals clear, TM's are without perforation, pearly grey and translucent with reflective cone of light bilaterally. Oral cavity moist, posterior pharynx without exudate, erythema, peritonsillar abscess.  Neck: Supple. No thyromegaly. No lymphadenopathy. Lungs: Clear bilaterally to auscultation without wheezes, rales, or rhonchi. Breathing is unlabored. Heart: Regular rhythm. No murmurs, rubs, or gallops. Msk:  Strength and tone normal for age. Extremities/Skin: Warm and  dry. Neuro: Alert and oriented X 3. Moves all extremities spontaneously. Gait is normal. CNII-XII grossly in tact. Psych:  Responds to questions appropriately with a normal affect.     ASSESSMENT AND PLAN:  45 y.o. year old male with   1. Cough At this time will obtain chest x-ray. Will have him take omeprazole daily and take Zyrtec daily. He can use the Hycodan as needed for symptom relief in the interim. Have him schedule follow-up visit with me in 3 weeks to follow-up and reassess. - omeprazole (PRILOSEC) 20 MG capsule; Take 1 capsule (20 mg total) by mouth daily.  Dispense: 30 capsule; Refill: 3 - cetirizine (ZYRTEC) 10 MG tablet; Take 1 tablet (10 mg total) by mouth daily.  Dispense: 30 tablet; Refill: 11 - DG Chest 2 View; Future - HYDROcodone-homatropine (HYCODAN) 5-1.5 MG/5ML syrup; Take 5 mLs by mouth every 8 (eight) hours as needed for cough.  Dispense: 120 mL; Refill: 0    Signed, [address] Beth Mount Enterprise[place]ixon, [place], [facility] 11/19/2016 10:14 AM

## 2016-12-11 ENCOUNTER — Encounter: Payer: 59 | Admitting: Physician Assistant

## 2016-12-12 NOTE — Progress Notes (Signed)
This encounter was created in error - please disregard.

## 2017-10-03 DIAGNOSIS — H6123 Impacted cerumen, bilateral: Secondary | ICD-10-CM | POA: Diagnosis not present

## 2017-10-30 IMAGING — CR DG CHEST 2V
2 series · 2 of 2 positions shown · non-contrast
Comparison: No recent prior .

CLINICAL DATA: Persistent cough.

EXAM:
CHEST  2 VIEW

[w chest pa]
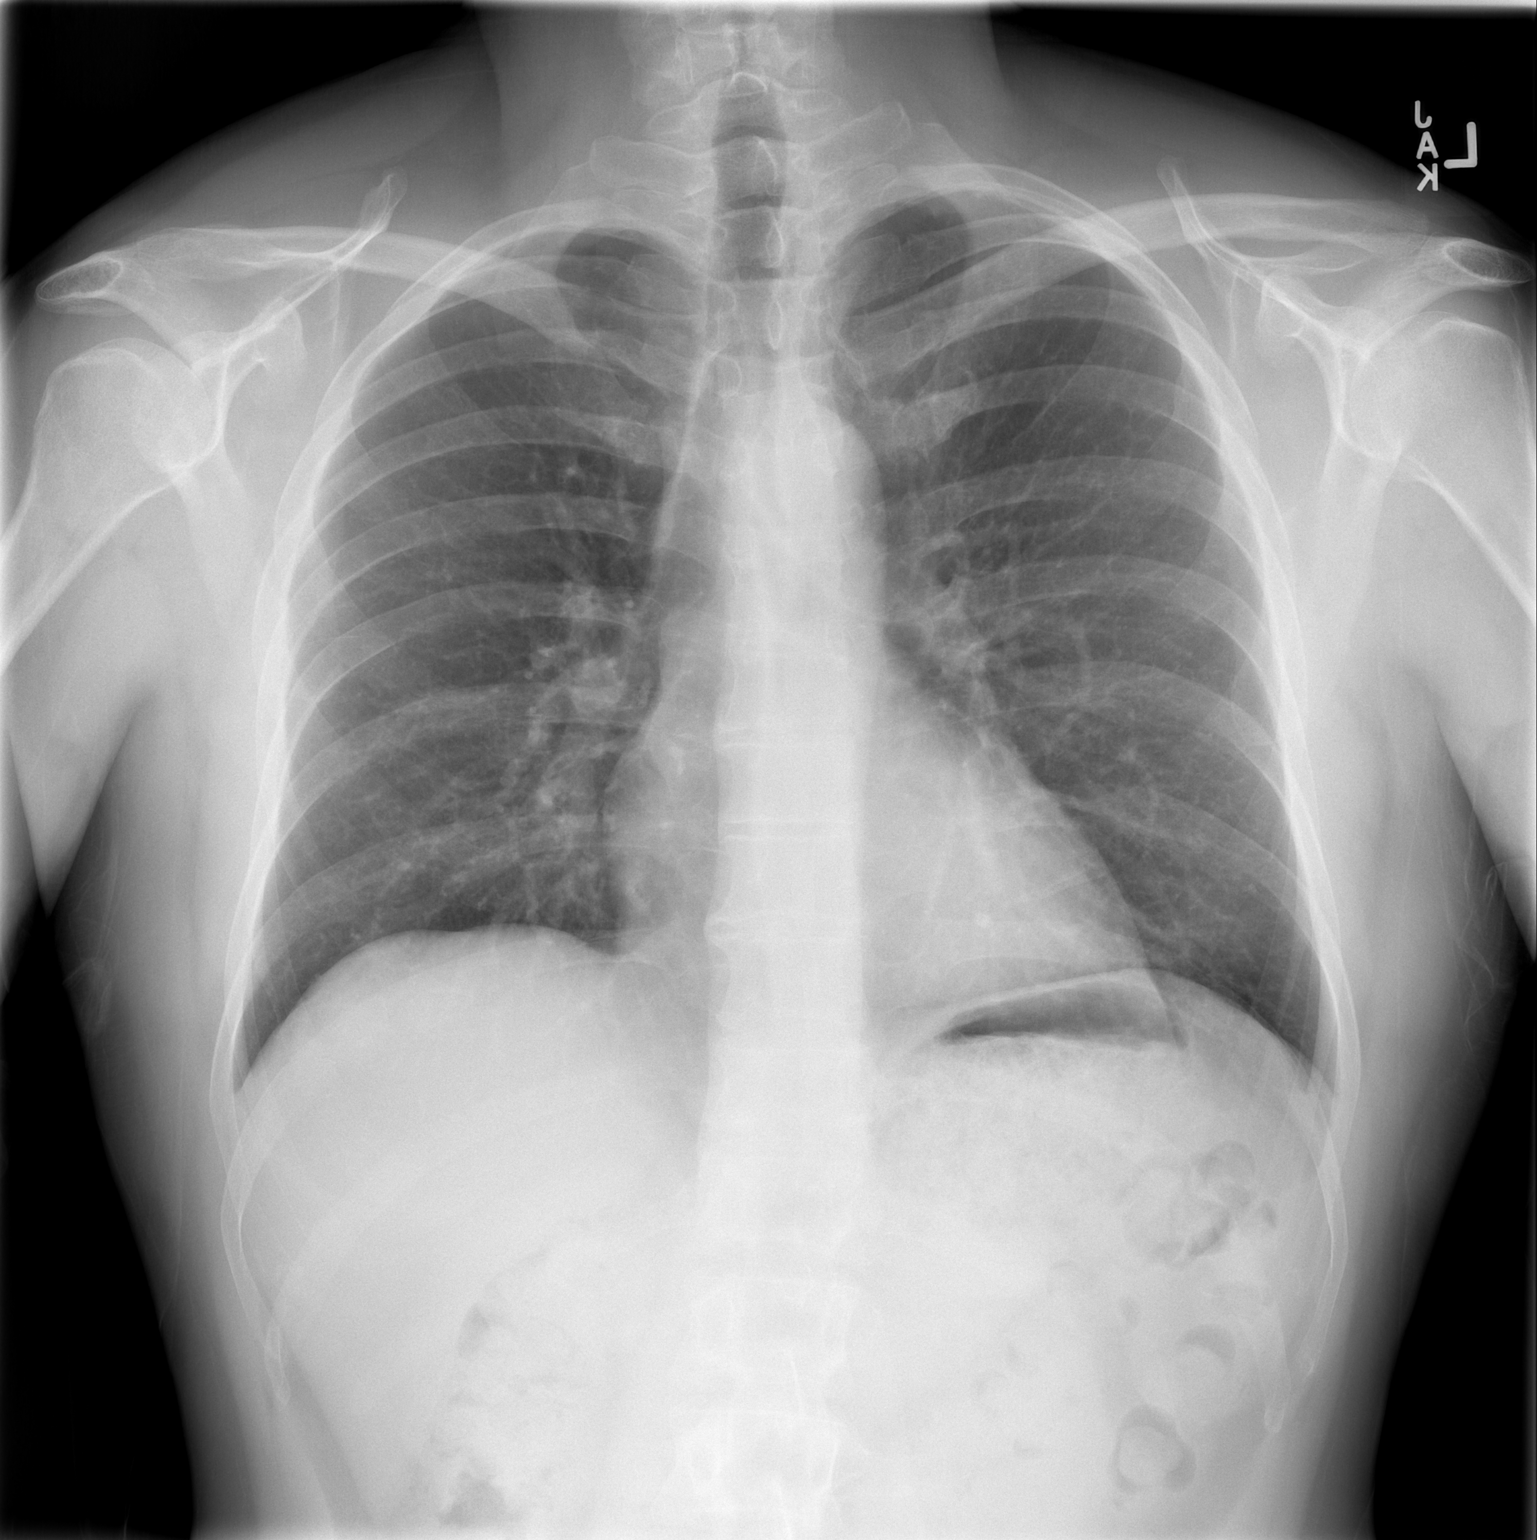

[w chest lat]
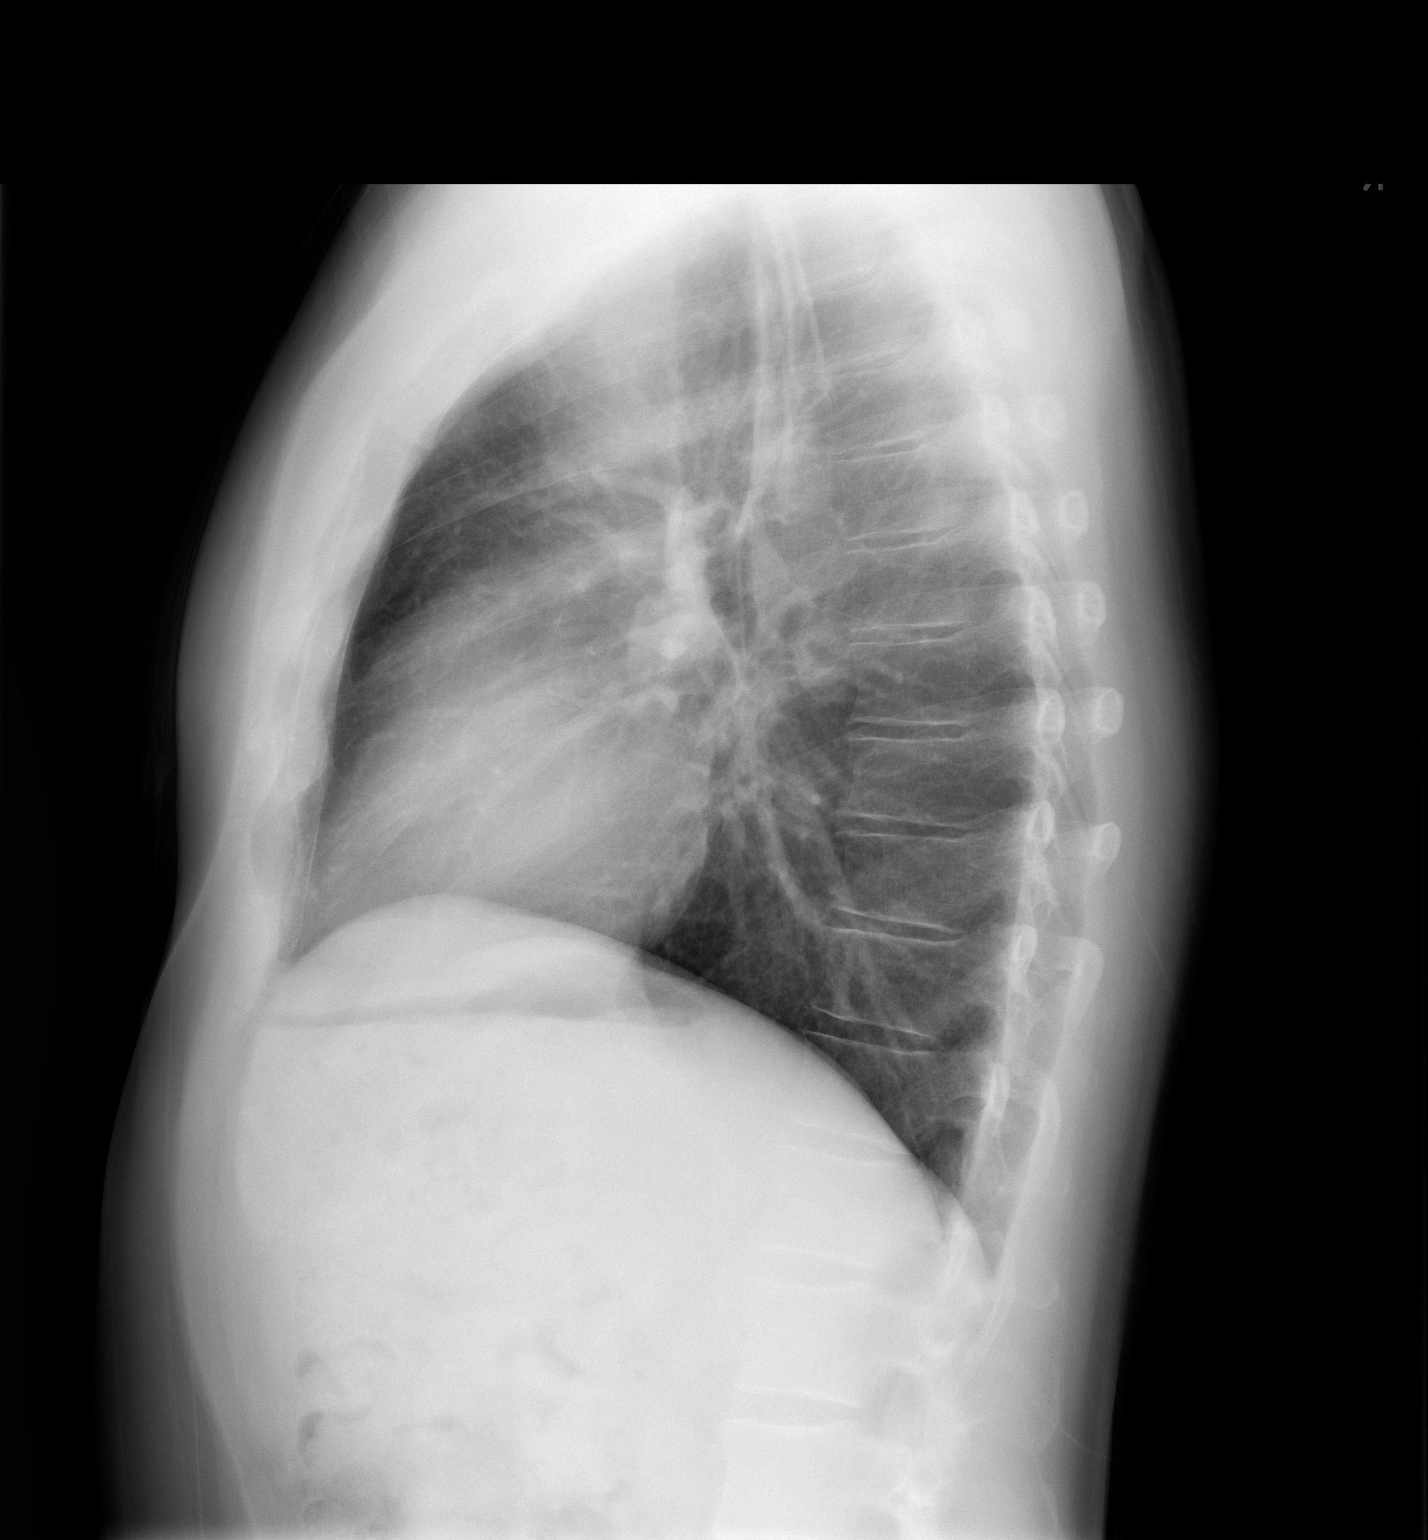

[2 of 2 positions shown; findings below may reference images not displayed]

FINDINGS: Mediastinum and hilar structures normal. Lungs are clear. No focal
infiltrate. No pleural effusion or pneumothorax. No acute bony
abnormality.
IMPRESSION: No acute cardiopulmonary disease.

## 2018-01-12 ENCOUNTER — Other Ambulatory Visit: Payer: Self-pay | Admitting: Family Medicine

## 2018-01-12 DIAGNOSIS — Z0000 Encounter for general adult medical examination without abnormal findings: Secondary | ICD-10-CM

## 2018-01-13 ENCOUNTER — Other Ambulatory Visit: Payer: 59

## 2018-01-13 DIAGNOSIS — Z0000 Encounter for general adult medical examination without abnormal findings: Secondary | ICD-10-CM | POA: Diagnosis not present

## 2018-01-13 LAB — COMPREHENSIVE METABOLIC PANEL
AG Ratio: 1.9 (calc) (ref 1.0–2.5)
ALKALINE PHOSPHATASE (APISO): 36 U/L — AB (ref 40–115)
ALT: 23 U/L (ref 9–46)
AST: 19 U/L (ref 10–40)
Albumin: 4.3 g/dL (ref 3.6–5.1)
BILIRUBIN TOTAL: 0.8 mg/dL (ref 0.2–1.2)
BUN: 16 mg/dL (ref 7–25)
CALCIUM: 9.3 mg/dL (ref 8.6–10.3)
CO2: 27 mmol/L (ref 20–32)
CREATININE: 0.96 mg/dL (ref 0.60–1.35)
Chloride: 108 mmol/L (ref 98–110)
Globulin: 2.3 g/dL (calc) (ref 1.9–3.7)
Glucose, Bld: 94 mg/dL (ref 65–99)
Potassium: 4.1 mmol/L (ref 3.5–5.3)
Sodium: 141 mmol/L (ref 135–146)
Total Protein: 6.6 g/dL (ref 6.1–8.1)

## 2018-01-13 LAB — CBC WITH DIFFERENTIAL/PLATELET
Basophils Absolute: 21 cells/uL (ref 0–200)
Basophils Relative: 0.5 %
EOS ABS: 201 {cells}/uL (ref 15–500)
Eosinophils Relative: 4.9 %
HCT: 44.7 % (ref 38.5–50.0)
Hemoglobin: 15.4 g/dL (ref 13.2–17.1)
Lymphs Abs: 1492 cells/uL (ref 850–3900)
MCH: 30.4 pg (ref 27.0–33.0)
MCHC: 34.5 g/dL (ref 32.0–36.0)
MCV: 88.2 fL (ref 80.0–100.0)
MPV: 9.9 fL (ref 7.5–12.5)
Monocytes Relative: 9.8 %
NEUTROS PCT: 48.4 %
Neutro Abs: 1984 cells/uL (ref 1500–7800)
PLATELETS: 175 10*3/uL (ref 140–400)
RBC: 5.07 10*6/uL (ref 4.20–5.80)
RDW: 12.5 % (ref 11.0–15.0)
TOTAL LYMPHOCYTE: 36.4 %
WBC: 4.1 10*3/uL (ref 3.8–10.8)
WBCMIX: 402 {cells}/uL (ref 200–950)

## 2018-01-13 LAB — LIPID PANEL
CHOL/HDL RATIO: 3.5 (calc) (ref <5.0)
CHOLESTEROL: 160 mg/dL (ref <200)
HDL: 46 mg/dL (ref >40)
LDL Cholesterol (Calc): 94 mg/dL (calc)
Non-HDL Cholesterol (Calc): 114 mg/dL (calc) (ref <130)
Triglycerides: 102 mg/dL (ref <150)

## 2018-01-16 ENCOUNTER — Encounter: Payer: Self-pay | Admitting: Family Medicine

## 2018-01-16 ENCOUNTER — Ambulatory Visit (INDEPENDENT_AMBULATORY_CARE_PROVIDER_SITE_OTHER): Payer: 59 | Admitting: Family Medicine

## 2018-01-16 VITALS — BP 146/80 | HR 110 | Temp 98.4°F | Resp 14 | Ht 65.0 in | Wt 150.0 lb

## 2018-01-16 DIAGNOSIS — R030 Elevated blood-pressure reading, without diagnosis of hypertension: Secondary | ICD-10-CM | POA: Diagnosis not present

## 2018-01-16 DIAGNOSIS — Z0000 Encounter for general adult medical examination without abnormal findings: Secondary | ICD-10-CM

## 2018-01-16 MED ORDER — MOMETASONE FUROATE 0.1 % EX CREA: 1.0000 "application " | TOPICAL_CREAM | Freq: Every day | CUTANEOUS | 0 refills | Status: DC

## 2018-01-16 NOTE — Progress Notes (Signed)
Subjective:    Patient ID: Michael Zavala, male    DOB: 09/27/1971, 47 y.o.   MRN: [id number]  HPI  Patient is here today for complete physical exam. 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 He sparingly uses Elocon cream. This controls the rash. One tube of cream last more than a year. Primarily flares up in the wintertime. He is requesting a refill on the cream. His blood pressure is extremely high today however he has documented whitecoat syndrome. His blood pressure and his heart rate is checked multiple times a week at home and at work and is always normal. He just gets anxious in a doctor's office. Otherwise he is doing well. His most recent labwork as listed below: Appointment on 01/13/2018  Component Date Value Ref Range Status  . WBC 01/13/2018 4.1  3.8 - 10.8 Thousand/uL Final  . RBC 01/13/2018 5.07  4.20 - 5.80 Million/uL Final  . Hemoglobin 01/13/2018 15.4  13.2 - 17.1 g/dL Final  . HCT 41/32/4401 44.7  38.5 - 50.0 % Final  . MCV 01/13/2018 88.2  80.0 - 100.0 fL Final  . MCH 01/13/2018 30.4  27.0 - 33.0 pg Final  . MCHC 01/13/2018 34.5  32.0 - 36.0 g/dL Final  . RDW 02/72/5366 12.5  11.0 - 15.0 % Final  . Platelets 01/13/2018 175  140 - 400 Thousand/uL Final  . MPV 01/13/2018 9.9  7.5 - 12.5 fL Final  . Neutro Abs 01/13/2018 1,984  1,500 - 7,800 cells/uL Final  . Lymphs Abs 01/13/2018 1,492  850 - 3,900 cells/uL Final  . WBC mixed population 01/13/2018 402  200 - 950 cells/uL Final  . Eosinophils Absolute 01/13/2018 201  15 - 500 cells/uL Final  . Basophils Absolute 01/13/2018 21  0 - 200 cells/uL Final  . Neutrophils Relative % 01/13/2018 48.4  % Final  . Total Lymphocyte 01/13/2018 36.4  % Final  . Monocytes Relative 01/13/2018 9.8  % Final  . Eosinophils Relative 01/13/2018 4.9  % Final  . Basophils Relative 01/13/2018 0.5  % Final  . Glucose, Bld 01/13/2018 94  65 - 99 mg/dL Final   Comment: .            Fasting reference interval .   . BUN  01/13/2018 16  7 - 25 mg/dL Final  . Creat 44/02/4741 0.96  0.60 - 1.35 mg/dL Final  . BUN/Creatinine Ratio 01/13/2018 NOT APPLICABLE  6 - 22 (calc) Final  . Sodium 01/13/2018 141  135 - 146 mmol/L Final  . Potassium 01/13/2018 4.1  3.5 - 5.3 mmol/L Final  . Chloride 01/13/2018 108  98 - 110 mmol/L Final  . CO2 01/13/2018 27  20 - 32 mmol/L Final  . Calcium 01/13/2018 9.3  8.6 - 10.3 mg/dL Final  . Total Protein 01/13/2018 6.6  6.1 - 8.1 g/dL Final  . Albumin 59/56/3875 4.3  3.6 - 5.1 g/dL Final  . Globulin 64/33/2951 2.3  1.9 - 3.7 g/dL (calc) Final  . AG Ratio 01/13/2018 1.9  1.0 - 2.5 (calc) Final  . Total Bilirubin 01/13/2018 0.8  0.2 - 1.2 mg/dL Final  . Alkaline phosphatase (APISO) 01/13/2018 36* 40 - 115 U/L Final  . AST 01/13/2018 19  10 - 40 U/L Final  . ALT 01/13/2018 23  9 - 46 U/L Final  . Cholesterol 01/13/2018 160  <200 mg/dL Final  . HDL 88/41/6606 46  >40 mg/dL Final  . Triglycerides 01/13/2018 102  <150 mg/dL Final  .  LDL Cholesterol (Calc) 01/13/2018 94  mg/dL (calc) Final   Comment: Reference range: <100 . Desirable range <100 mg/dL for primary prevention;   <70 mg/dL for patients with CHD or diabetic patients  with > or = 2 CHD risk factors. Marland Kitchen LDL-C is now calculated using the Martin-Hopkins  calculation, which is a validated novel method providing  better accuracy than the Friedewald equation in the  estimation of LDL-C.  Horald Pollen et al. Lenox Ahr. 1914;782(95): 2061-2068  (http://education.[facility].com/faq/FAQ164)   . Total CHOL/HDL Ratio 01/13/2018 3.5  <6.2 (calc) Final  . Non-HDL Cholesterol (Calc) 01/13/2018 114  <130 mg/dL (calc) Final   Comment: For patients with diabetes plus 1 major ASCVD risk  factor, treating to a non-HDL-C goal of <100 mg/dL  (LDL-C of <13 mg/dL) is considered a therapeutic  option.    Past Medical History:  Diagnosis Date  . PVC (premature ventricular contraction)   . White coat syndrome without diagnosis of  hypertension    Social History   Socioeconomic History  . Marital status: Divorced    Spouse name: Not on file  . Number of children: Not on file  . Years of education: Not on file  . Highest education level: Not on file  Social Needs  . Financial resource strain: Not on file  . Food insecurity - worry: Not on file  . Food insecurity - inability: Not on file  . Transportation needs - medical: Not on file  . Transportation needs - non-medical: Not on file  Occupational History  . Not on file  Tobacco Use  . Smoking status: Never Smoker  . Smokeless tobacco: Never Used  Substance and Sexual Activity  . Alcohol use: Yes    Comment: Rare  . Drug use: No  . 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  Other Topics Concern  . Not on file  Social History Narrative  . Not on file   Family History  Problem Relation Age of Onset  . Heart disease Father 101  . Heart disease Sister 72     Review of Systems  All other systems reviewed and are negative.      Objective:   Physical Exam  Constitutional: He is oriented to person, place, and time. He appears well-developed and well-nourished. No distress.  HENT:  Head: Normocephalic and atraumatic.  Right Ear: External ear normal.  Left Ear: External ear normal.  Nose: Nose normal.  Mouth/Throat: Oropharynx is clear and moist. No oropharyngeal exudate.  Eyes: Conjunctivae and EOM are normal. Pupils are equal, round, and reactive to light. Right eye exhibits no discharge. Left eye exhibits no discharge. No scleral icterus.  Neck: Normal range of motion. Neck supple. No JVD present. No thyromegaly present.  Cardiovascular: Normal rate and regular rhythm. Exam reveals no gallop and no friction rub.  No murmur heard. Pulmonary/Chest: Effort normal and breath sounds normal. No respiratory distress. He has no wheezes. He has no rales. He exhibits no tenderness.  Abdominal: Soft. Bowel sounds are normal. He exhibits  no distension and no mass. There is no tenderness. There is no rebound and no guarding.  Musculoskeletal: Normal range of motion. He exhibits no edema or tenderness.  Lymphadenopathy:    He has no cervical adenopathy.  Neurological: He is alert and oriented to person, place, and time. He has normal reflexes. No cranial nerve deficit. He exhibits normal muscle tone. Coordination normal.  Skin: Skin is warm. No rash noted. He is  not diaphoretic. No erythema. No pallor.  Psychiatric: He has a normal mood and affect. His behavior is normal. Judgment and thought content normal.  Vitals reviewed.         Assessment & Plan:  1. Routine general medical examination at a health care facility Labs were within normal. Physical exam is completely normal. Cancer screening is up to date. I recommended a flu shot and tetanus and patient declines both. Recheck in one year. Use Elocon cream once daily for 1 week. Once rash has improved on the volar surface of his right wrist, switch to moisturizers to prevent. Patient has white coat syndrome. Recommended he continue to monitor his blood pressure daily at home and notify me if greater than 140/90

## 2018-05-06 ENCOUNTER — Ambulatory Visit: Payer: 59 | Admitting: Physician Assistant

## 2018-05-06 ENCOUNTER — Other Ambulatory Visit: Payer: Self-pay

## 2018-05-06 ENCOUNTER — Telehealth: Payer: Self-pay

## 2018-05-06 ENCOUNTER — Encounter: Payer: Self-pay | Admitting: Family Medicine

## 2018-05-06 ENCOUNTER — Encounter: Payer: Self-pay | Admitting: Physician Assistant

## 2018-05-06 VITALS — BP 144/82 | HR 102 | Temp 98.0°F | Resp 18 | Ht 65.0 in | Wt 151.2 lb

## 2018-05-06 DIAGNOSIS — J988 Other specified respiratory disorders: Secondary | ICD-10-CM | POA: Diagnosis not present

## 2018-05-06 DIAGNOSIS — B9689 Other specified bacterial agents as the cause of diseases classified elsewhere: Secondary | ICD-10-CM

## 2018-05-06 MED ORDER — AZITHROMYCIN 250 MG PO TABS: ORAL_TABLET | ORAL | 0 refills | Status: DC

## 2018-05-06 MED ORDER — HYDROCODONE-HOMATROPINE 5-1.5 MG/5ML PO SYRP: 5.0000 mL | ORAL_SOLUTION | Freq: Four times a day (QID) | ORAL | 0 refills | Status: DC | PRN

## 2018-05-06 NOTE — Progress Notes (Signed)
Patient ID: Michael Zavala MRN: [id number], DOB: 09-23-1971, 47 y.o. Date of Encounter: 05/06/2018, 11:09 AM    Chief Complaint:  Chief Complaint  Patient presents with  . Cough    dry cough symptoms for 7-8 days     HPI: 47 y.o. year old male presents with above.   Today I did review his prior notes with me from 11/14/2016 and 11/19/2016.  He states that what I gave him back then it "knocked it out "--"  "except that one little Pearl did not work-- but that liquid did"--- meaning that the Tessalon was ineffective for him but the Hycodan worked.  States that he feels exactly the same as he felt when he came for this visit.  States that he took his mom to [place] a week ago.  States that "everywhere they went "everybody was coughing ".  Says then when they came back from the trip he started to have some irritated throat some congestion and felt like he had chills.  Took some NyQuil that night and the next morning thought he felt okay.  However during that day felt a little bit of a sore throat then developed cough in addition to sore throat.  States finally the sore throat and chills are gone but still with this cough.  States that he just feels like there is phlegm in his throat and drainage in his throat that causes him to feel like he needs to clear his throat.  Says that it seems to be worse in the morning and then again at night.  Says that he feels fine except now just this drainage stuck in his throat that will not clear up.  Has used NyQuil, DayQuil and Robitussin.  No other concerns to address today.     Home Meds:   Outpatient Medications Prior to Visit  Medication Sig Dispense Refill  . mometasone (ELOCON) 0.1 % cream Apply 1 application topically daily. 45 g 0   No facility-administered medications prior to visit.     Allergies: No Known Allergies    Review of Systems: See HPI for pertinent ROS. All other ROS negative.    Physical Exam: Blood pressure (!) 144/82,  pulse (!) 102, temperature 98 F (36.7 C), temperature source Oral, resp. rate 18, height  (1.651 m), weight 68.6 kg (151 lb 3.2 oz), SpO2 95 %., Body mass index is 25.16 kg/m. General:  WNWD WM. Appears in no acute distress. HEENT: Normocephalic, atraumatic, eyes without discharge, sclera non-icteric, nares are without discharge. Bilateral auditory canals clear, TM's are without perforation, pearly grey and translucent with reflective cone of light bilaterally. Oral cavity moist, posterior pharynx without exudate, erythema, peritonsillar abscess.  Neck: Supple. No thyromegaly. No lymphadenopathy. Lungs: Clear bilaterally to auscultation without wheezes, rales, or rhonchi. Breathing is unlabored. Heart: Regular rhythm. No murmurs, rubs, or gallops. Msk:  Strength and tone normal for age. Extremities/Skin: Warm and dry.  Neuro: Alert and oriented X 3. Moves all extremities spontaneously. Gait is normal. CNII-XII grossly in tact. Psych:  Responds to questions appropriately with a normal affect.     ASSESSMENT AND PLAN:  47 y.o. year old male with  1. Bacterial respiratory infection He is to take the azithromycin as directed.  Follow-up if symptoms do not resolve within 1 week after completion of antibiotic.  As well he can use the Hycodan as needed/ as directed as cough suppressant. - azithromycin (ZITHROMAX) 250 MG tablet; Day 1: Take 2 daily.  Days 2 -  5: Take 1 daily.  Dispense: 6 tablet; Refill: 0 - HYDROcodone-homatropine (HYCODAN) 5-1.5 MG/5ML syrup; Take 5 mLs by mouth every 6 (six) hours as needed.  Dispense: 120 mL; Refill: 0   Signed, [address] [place], [place], [facility] 05/06/2018 11:09 AM

## 2018-05-06 NOTE — Telephone Encounter (Signed)
Patient states [facility] [address] does not have the cough syrup and would like to have the [facility] to [facility] [address]

## 2019-09-29 ENCOUNTER — Other Ambulatory Visit: Payer: 59

## 2019-09-29 ENCOUNTER — Other Ambulatory Visit: Payer: Self-pay

## 2019-09-29 DIAGNOSIS — Z0000 Encounter for general adult medical examination without abnormal findings: Secondary | ICD-10-CM

## 2019-09-30 LAB — CBC WITH DIFFERENTIAL/PLATELET
Absolute Monocytes: 398 cells/uL (ref 200–950)
Basophils Absolute: 29 cells/uL (ref 0–200)
Basophils Relative: 0.7 %
Eosinophils Absolute: 238 cells/uL (ref 15–500)
Eosinophils Relative: 5.8 %
HCT: 45.5 % (ref 38.5–50.0)
Hemoglobin: 15.3 g/dL (ref 13.2–17.1)
Lymphs Abs: 1382 cells/uL (ref 850–3900)
MCH: 31 pg (ref 27.0–33.0)
MCHC: 33.6 g/dL (ref 32.0–36.0)
MCV: 92.3 fL (ref 80.0–100.0)
MPV: 9.6 fL (ref 7.5–12.5)
Monocytes Relative: 9.7 %
Neutro Abs: 2054 cells/uL (ref 1500–7800)
Neutrophils Relative %: 50.1 %
Platelets: 172 10*3/uL (ref 140–400)
RBC: 4.93 10*6/uL (ref 4.20–5.80)
RDW: 12.4 % (ref 11.0–15.0)
Total Lymphocyte: 33.7 %
WBC: 4.1 10*3/uL (ref 3.8–10.8)

## 2019-09-30 LAB — COMPREHENSIVE METABOLIC PANEL
AG Ratio: 1.7 (calc) (ref 1.0–2.5)
ALT: 28 U/L (ref 9–46)
AST: 22 U/L (ref 10–35)
Albumin: 4.4 g/dL (ref 3.6–5.1)
Alkaline phosphatase (APISO): 37 U/L (ref 35–144)
BUN: 18 mg/dL (ref 7–25)
CO2: 28 mmol/L (ref 20–32)
Calcium: 9.5 mg/dL (ref 8.6–10.3)
Chloride: 104 mmol/L (ref 98–110)
Creat: 1.04 mg/dL (ref 0.70–1.33)
Globulin: 2.6 g/dL (calc) (ref 1.9–3.7)
Glucose, Bld: 94 mg/dL (ref 65–99)
Potassium: 4.6 mmol/L (ref 3.5–5.3)
Sodium: 140 mmol/L (ref 135–146)
Total Bilirubin: 0.6 mg/dL (ref 0.2–1.2)
Total Protein: 7 g/dL (ref 6.1–8.1)

## 2019-09-30 LAB — LIPID PANEL
Cholesterol: 179 mg/dL (ref <200)
HDL: 53 mg/dL (ref >=40)
LDL Cholesterol (Calc): 108 mg/dL (calc) — ABNORMAL HIGH
Non-HDL Cholesterol (Calc): 126 mg/dL (calc) (ref <130)
Total CHOL/HDL Ratio: 3.4 (calc) (ref <5.0)
Triglycerides: 87 mg/dL (ref <150)

## 2019-10-08 ENCOUNTER — Telehealth: Payer: Self-pay | Admitting: Family Medicine

## 2019-10-08 DIAGNOSIS — Z1211 Encounter for screening for malignant neoplasm of colon: Secondary | ICD-10-CM

## 2019-10-08 NOTE — Telephone Encounter (Signed)
Patient called in requesting a referral to get his colonoscopy with Dr.Medoff. Is it ok to place referral?

## 2019-10-11 NOTE — Telephone Encounter (Signed)
ok 

## 2019-10-11 NOTE — Telephone Encounter (Signed)
Referral placed.

## 2019-11-18 ENCOUNTER — Other Ambulatory Visit: Payer: Self-pay

## 2019-11-19 ENCOUNTER — Ambulatory Visit (INDEPENDENT_AMBULATORY_CARE_PROVIDER_SITE_OTHER): Payer: 59 | Admitting: Family Medicine

## 2019-11-19 ENCOUNTER — Encounter: Payer: Self-pay | Admitting: Family Medicine

## 2019-11-19 VITALS — BP 150/104 | HR 120 | Temp 98.8°F | Resp 12 | Ht 65.0 in | Wt 145.0 lb

## 2019-11-19 DIAGNOSIS — Z0000 Encounter for general adult medical examination without abnormal findings: Secondary | ICD-10-CM

## 2019-11-19 DIAGNOSIS — Z125 Encounter for screening for malignant neoplasm of prostate: Secondary | ICD-10-CM

## 2019-11-19 NOTE — Progress Notes (Signed)
Subjective:    Patient ID: Michael Zavala, male    DOB: [birth year], 48 y.o.   MRN: [id number]  HPI Patient is a very pleasant 48 year old Caucasian male here today for complete physical exam.  He recently had his colonoscopy earlier this year and his colonoscopy came back completely normal.  He has been given a 10-year clearance.  He is due for prostate cancer screening and therefore he needs his PSA.  His blood pressure and his heart rate are extremely high today.  The patient has a well-documented history of whitecoat syndrome.  He regularly checks his blood pressure at home.  He denies any blood pressures greater than 140/90.  He denies any history of tachycardia.  I have asked the patient to monitor his blood pressure closely at home which he does and if he consistently sees it higher than 140/90 or heart rate greater than 100 to contact my office however the majority of this seems to be due to excess adrenaline from whitecoat syndrome.  He is due for a tetanus shot which she politely declines today.  He is also due for a flu shot which we discussed but he also declines this today.  He is due for HIV screening but the patient denies any risk factors and therefore is not interested in having this.  Otherwise his only concern is a hematoma that is on his right great toenail.  Patient saw this in late May.  It has not changed.  It is an irregularly shaped purple macule underneath the toenail.  It definitely appears to be dried blood.  There is no streaking.  It is not enlarging.  The only concerning feature is it has not grown out since May.  It does not appear to be a melanoma.  I am comfortable watching this at the present time.  Current diameter is approximately 4 to 5 mm with irregular borders similar to a "paint splatter".  No visits with results within 1 Month(s) from this visit.  Latest known visit with results is:  Lab on 09/29/2019  Component Date Value Ref Range Status  . Cholesterol 09/29/2019  179  <200 mg/dL Final  . HDL 09/29/2019 53  > OR = 40 mg/dL Final  . Triglycerides 09/29/2019 87  <150 mg/dL Final  . LDL Cholesterol (Calc) 09/29/2019 108* mg/dL (calc) Final   Comment: Reference range: <100 . Desirable range <100 mg/dL for primary prevention;   <70 mg/dL for patients with CHD or diabetic patients  with > or = 2 CHD risk factors. Marland Kitchen LDL-C is now calculated using the Martin-Hopkins  calculation, which is a validated novel method providing  better accuracy than the Friedewald equation in the  estimation of LDL-C.  Cresenciano Genre et al. Annamaria Helling. 5427;062(37): 2061-2068  (http://education.[facility].com/faq/FAQ164)   . Total CHOL/HDL Ratio 09/29/2019 3.4  <5.0 (calc) Final  . Non-HDL Cholesterol (Calc) 09/29/2019 126  <130 mg/dL (calc) Final   Comment: For patients with diabetes plus 1 major ASCVD risk  factor, treating to a non-HDL-C goal of <100 mg/dL  (LDL-C of <70 mg/dL) is considered a therapeutic  option.   . WBC 09/29/2019 4.1  3.8 - 10.8 Thousand/uL Final  . RBC 09/29/2019 4.93  4.20 - 5.80 Million/uL Final  . Hemoglobin 09/29/2019 15.3  13.2 - 17.1 g/dL Final  . HCT 09/29/2019 45.5  38.5 - 50.0 % Final  . MCV 09/29/2019 92.3  80.0 - 100.0 fL Final  . MCH 09/29/2019 31.0  27.0 - 33.0 pg Final  .  MCHC 09/29/2019 33.6  32.0 - 36.0 g/dL Final  . RDW 56/43/3295 12.4  11.0 - 15.0 % Final  . Platelets 09/29/2019 172  140 - 400 Thousand/uL Final  . MPV 09/29/2019 9.6  7.5 - 12.5 fL Final  . Neutro Abs 09/29/2019 2,054  1,500 - 7,800 cells/uL Final  . Lymphs Abs 09/29/2019 1,382  850 - 3,900 cells/uL Final  . Absolute Monocytes 09/29/2019 398  200 - 950 cells/uL Final  . Eosinophils Absolute 09/29/2019 238  15 - 500 cells/uL Final  . Basophils Absolute 09/29/2019 29  0 - 200 cells/uL Final  . Neutrophils Relative % 09/29/2019 50.1  % Final  . Total Lymphocyte 09/29/2019 33.7  % Final  . Monocytes Relative 09/29/2019 9.7  % Final  . Eosinophils Relative 09/29/2019  5.8  % Final  . Basophils Relative 09/29/2019 0.7  % Final  . Glucose, Bld 09/29/2019 94  65 - 99 mg/dL Final   Comment: .            Fasting reference interval .   . BUN 09/29/2019 18  7 - 25 mg/dL Final  . Creat 18/84/1660 1.04  0.70 - 1.33 mg/dL Final   Comment: For patients >34 years of age, the reference limit for Creatinine is approximately 13% higher for people identified as African-American. .   Edwena Felty Ratio 09/29/2019 NOT APPLICABLE  6 - 22 (calc) Final  . Sodium 09/29/2019 140  135 - 146 mmol/L Final  . Potassium 09/29/2019 4.6  3.5 - 5.3 mmol/L Final  . Chloride 09/29/2019 104  98 - 110 mmol/L Final  . CO2 09/29/2019 28  20 - 32 mmol/L Final  . Calcium 09/29/2019 9.5  8.6 - 10.3 mg/dL Final  . Total Protein 09/29/2019 7.0  6.1 - 8.1 g/dL Final  . Albumin 63/12/6008 4.4  3.6 - 5.1 g/dL Final  . Globulin 93/23/5573 2.6  1.9 - 3.7 g/dL (calc) Final  . AG Ratio 09/29/2019 1.7  1.0 - 2.5 (calc) Final  . Total Bilirubin 09/29/2019 0.6  0.2 - 1.2 mg/dL Final  . Alkaline phosphatase (APISO) 09/29/2019 37  35 - 144 U/L Final  . AST 09/29/2019 22  10 - 35 U/L Final  . ALT 09/29/2019 28  9 - 46 U/L Final   Past Medical History:  Diagnosis Date  . PVC (premature ventricular contraction)   . White coat syndrome without diagnosis of hypertension    Social History   Socioeconomic History  . Marital status: Divorced    Spouse name: Not on file  . Number of children: Not on file  . Years of education: Not on file  . Highest education level: Not on file  Occupational History  . Not on file  Social Needs  . Financial resource strain: Not on file  . Food insecurity    Worry: Not on file    Inability: Not on file  . Transportation needs    Medical: Not on file    Non-medical: Not on file  Tobacco Use  . Smoking status: Never Smoker  . Smokeless tobacco: Never Used  Substance and Sexual Activity  . Alcohol use: Yes    Comment: Rare  . Drug use: No  . 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  Lifestyle  . Physical activity    Days per week: Not on file    Minutes per session: Not on file  . Stress: Not on file  Relationships  .  Social Musicianconnections    Talks on phone: Not on file    Gets together: Not on file    Attends religious service: Not on file    Active member of club or organization: Not on file    Attends meetings of clubs or organizations: Not on file    Relationship status: Not on file  . Intimate partner violence    Fear of current or ex partner: Not on file    Emotionally abused: Not on file    Physically abused: Not on file    Forced sexual activity: Not on file  Other Topics Concern  . Not on file  Social History Narrative  . Not on file   Family History  Problem Relation Age of Onset  . Heart disease Father 1250  . Heart disease Sister 4851     Review of Systems  All other systems reviewed and are negative.      Objective:   Physical Exam  Constitutional: He is oriented to person, place, and time. He appears well-developed and well-nourished. No distress.  HENT:  Head: Normocephalic and atraumatic.  Right Ear: External ear normal.  Left Ear: External ear normal.  Nose: Nose normal.  Mouth/Throat: Oropharynx is clear and moist. No oropharyngeal exudate.  Eyes: Pupils are equal, round, and reactive to light. Conjunctivae and EOM are normal. Right eye exhibits no discharge. Left eye exhibits no discharge. No scleral icterus.  Neck: Normal range of motion. Neck supple. No JVD present. No thyromegaly present.  Cardiovascular: Normal rate and regular rhythm. Exam reveals no gallop and no friction rub.  No murmur heard. Pulmonary/Chest: Effort normal and breath sounds normal. No respiratory distress. He has no wheezes. He has no rales. He exhibits no tenderness.  Abdominal: Soft. Bowel sounds are normal. He exhibits no distension and no mass. There is no abdominal tenderness. There is no rebound  and no guarding.  Musculoskeletal: Normal range of motion.        General: No tenderness or edema.  Lymphadenopathy:    He has no cervical adenopathy.  Neurological: He is alert and oriented to person, place, and time. He has normal reflexes. No cranial nerve deficit. He exhibits normal muscle tone. Coordination normal.  Skin: Skin is warm. No rash noted. He is not diaphoretic. No erythema. No pallor.  Psychiatric: He has a normal mood and affect. His behavior is normal. Judgment and thought content normal.  Vitals reviewed.         Assessment & Plan:  Prostate cancer screening - Plan: PSA  General medical exam  Physical exam today is completely normal except for his elevated blood pressure and heart rate which is due to whitecoat syndrome.  Patient will monitor his blood pressure and heart rate closely at home and notify me if the values are elevated.  I recommended a flu shot as well as a tetanus shot today but the patient politely declined.  He also declined HIV screening.  Colonoscopy is up-to-date.  The remainder of his lab work is outstanding.  I will check a PSA to complete prostate cancer screening.

## 2019-11-20 LAB — PSA: PSA: 1 ng/mL (ref <=4.0)

## 2020-10-16 ENCOUNTER — Telehealth: Payer: Self-pay | Admitting: Family Medicine

## 2020-10-16 NOTE — Telephone Encounter (Signed)
Please place referral to infusion clinic.  He does not require z-pack.  No evidence that this helps.  Not recommended.

## 2020-10-16 NOTE — Telephone Encounter (Signed)
Cb # [phone] Still having dry cough taken Nyquil for couple days would like to see if Dr. Tanya Nones prescribe Zpack also cough medication. Also would like to know if Dr.Pickard can referral him to get the infusion for the covid ??

## 2020-10-16 NOTE — Telephone Encounter (Signed)
Looks like pt was Dx with COVID on 10/15/2020 the Urgent Care and wold like to know if you could prescribe a Z pack and refer him to the infusion clinic.  Please Advise

## 2020-10-17 ENCOUNTER — Telehealth: Payer: Self-pay | Admitting: Family Medicine

## 2020-10-17 ENCOUNTER — Other Ambulatory Visit: Payer: Self-pay | Admitting: Family Medicine

## 2020-10-17 MED ORDER — HYDROCODONE-HOMATROPINE 5-1.5 MG/5ML PO SYRP: 5.0000 mL | ORAL_SOLUTION | Freq: Three times a day (TID) | ORAL | 0 refills | Status: DC | PRN

## 2020-10-17 NOTE — Telephone Encounter (Signed)
I recommended he be referred to infusion clinic.  I can send in cough medicine.

## 2020-10-17 NOTE — Telephone Encounter (Signed)
Pt would like to know if Dr.Pickard going send cough medication to  his pharmacy     [id number] [place], [place] - [address]

## 2020-10-17 NOTE — Telephone Encounter (Signed)
Referral placed for infusion

## 2020-10-17 NOTE — Telephone Encounter (Signed)
Patient COVID +.  MD please advise.

## 2020-10-18 ENCOUNTER — Other Ambulatory Visit: Payer: Self-pay | Admitting: Infectious Diseases

## 2020-10-18 ENCOUNTER — Telehealth: Payer: Self-pay | Admitting: Infectious Diseases

## 2020-10-18 DIAGNOSIS — U071 COVID-19: Secondary | ICD-10-CM

## 2020-10-18 DIAGNOSIS — I10 Essential (primary) hypertension: Secondary | ICD-10-CM

## 2020-10-18 NOTE — Progress Notes (Signed)
I connected by phone with Michael Zavala on 10/18/2020 at 1:51 PM to discuss the potential use of a new treatment for mild to moderate COVID-19 viral infection in non-hospitalized patients.  This patient is a 49 y.o. male that meets the FDA criteria for Emergency Use Authorization of COVID monoclonal antibody casirivimab/imdevimab or bamlanivimab/eteseviamb.  Has a (+) direct SARS-CoV-2 viral test result  Has mild or moderate COVID-19   Is NOT hospitalized due to COVID-19  Is within 10 days of symptom onset  Has at least one of the high risk factor(s) for progression to severe COVID-19 and/or hospitalization as defined in EUA.  Specific high risk criteria : Cardiovascular disease or hypertension   I have spoken and communicated the following to the patient or parent/caregiver regarding COVID monoclonal antibody treatment:  1. FDA has authorized the emergency use for the treatment of mild to moderate COVID-19 in adults and pediatric patients with positive results of direct SARS-CoV-2 viral testing who are 13 years of age and older weighing at least 40 kg, and who are at high risk for progressing to severe COVID-19 and/or hospitalization.  2. The significant known and potential risks and benefits of COVID monoclonal antibody, and the extent to which such potential risks and benefits are unknown.  3. Information on available alternative treatments and the risks and benefits of those alternatives, including clinical trials.  4. Patients treated with COVID monoclonal antibody should continue to self-isolate and use infection control measures (e.g., wear mask, isolate, social distance, avoid sharing personal items, clean and disinfect "high touch" surfaces, and frequent handwashing) according to CDC guidelines.   5. The patient or parent/caregiver has the option to accept or refuse COVID monoclonal antibody treatment.  After reviewing this information with the patient, the patient has agreed to  receive one of the available covid 19 monoclonal antibodies and will be provided an appropriate fact sheet prior to infusion. Rexene Alberts, NP 10/18/2020 1:51 PM

## 2020-10-18 NOTE — Telephone Encounter (Signed)
Called to Discuss with patient about Covid symptoms and the use of the monoclonal antibody infusion for those with mild to moderate Covid symptoms and at a high risk of hospitalization.     Pt appears to qualify for this infusion due to co-morbid conditions and/or a member of an at-risk group in accordance with the FDA Emergency Use Authorization.    Started with symptoms late Saturday evening he started having fevers/chills and bad headache. The cough is the worse.  Qualifies for infusion with high blood pressure history.       The address for the infusion clinic site is:  --GPS address is [address] - the parking is located near [employer] building where you will see  COVID19 Infusion feather banner marking the entrance to parking.   (see photos below)            --Enter into the 2nd entrance where the "wave, flag banner" is at the road. Turn into this 2nd entrance and immediately turn left to park in 1 of the 5 parking spots.   --Please stay in your car and call the desk for assistance inside [phone].   --Average time in department is roughly 2 hours for Regeneron treatment - this includes preparation of the medication, IV start and the required 1 hour monitoring after the infusion.    Should you develop worsening shortness of breath, chest pain or severe breathing problems please do not wait for this appointment and go to the Emergency room for evaluation and treatment. You will undergo another oxygen screen before your infusion to ensure this is the best treatment option for you. There is a chance that the best decision may be to send you to the Emergency Room for evaluation at the time of your appointment.   The day of your visit you should: Marland Kitchen Get plenty of rest the night before and drink plenty of water . Eat a light meal/snack before coming and take your medications as prescribed  . Wear warm, comfortable clothes with a shirt that can roll-up over the elbow  (will need IV start).  . Wear a mask  . Consider bringing some activity to help pass the time  Many commercial insurers are waiving bills related to COVID treatment however some have ranged from $300-640. We are starting to see some insurers send bills to patients later for the administration of the medication - we are learning more information but you may receive a bill after your appointment.  Please contact your insurance agent to discuss prior to your appointment if you would like further details about billing specific to your policy.    The CPT code is [phone] for your reference.    I hope this helps find you feeling better,  Rexene Alberts

## 2020-10-19 ENCOUNTER — Ambulatory Visit (HOSPITAL_COMMUNITY)
Admission: RE | Admit: 2020-10-19 | Discharge: 2020-10-19 | Disposition: A | Discharge status: Home / Self Care | Payer: 59 | Source: Ambulatory Visit | Attending: Pulmonary Disease | Admitting: Pulmonary Disease

## 2020-10-19 DIAGNOSIS — U071 COVID-19: Secondary | ICD-10-CM | POA: Diagnosis present

## 2020-10-19 DIAGNOSIS — I10 Essential (primary) hypertension: Secondary | ICD-10-CM | POA: Insufficient documentation

## 2020-10-19 MED ORDER — DIPHENHYDRAMINE HCL 50 MG/ML IJ SOLN: 50.0000 mg | Freq: Once | INTRAMUSCULAR | Status: DC | PRN

## 2020-10-19 MED ORDER — METHYLPREDNISOLONE SODIUM SUCC 125 MG IJ SOLR: 125.0000 mg | Freq: Once | INTRAMUSCULAR | Status: DC | PRN

## 2020-10-19 MED ORDER — EPINEPHRINE 0.3 MG/0.3ML IJ SOAJ: 0.3000 mg | Freq: Once | INTRAMUSCULAR | Status: DC | PRN

## 2020-10-19 MED ORDER — SODIUM CHLORIDE 0.9 % IV SOLN: INTRAVENOUS | Status: DC | PRN

## 2020-10-19 MED ORDER — ALBUTEROL SULFATE HFA 108 (90 BASE) MCG/ACT IN AERS: 2.0000 | INHALATION_SPRAY | Freq: Once | RESPIRATORY_TRACT | Status: DC | PRN

## 2020-10-19 MED ORDER — SODIUM CHLORIDE 0.9 % IV SOLN: Freq: Once | INTRAVENOUS | Status: AC

## 2020-10-19 MED ORDER — FAMOTIDINE IN NACL 20-0.9 MG/50ML-% IV SOLN: 20.0000 mg | Freq: Once | INTRAVENOUS | Status: DC | PRN

## 2020-10-19 NOTE — Progress Notes (Signed)
  Diagnosis: COVID-19  Physician: Dr. Wright  Procedure: Covid Infusion Clinic Med: bamlanivimab\etesevimab infusion - Provided patient with bamlanimivab\etesevimab fact sheet for patients, parents and caregivers prior to infusion.  Complications: No immediate complications noted.  Discharge: Discharged home   Michael Zavala 10/19/2020  

## 2020-10-19 NOTE — Discharge Instructions (Signed)

## 2021-02-26 ENCOUNTER — Other Ambulatory Visit: Payer: Self-pay

## 2021-02-26 ENCOUNTER — Ambulatory Visit: Payer: 59 | Admitting: Family Medicine

## 2021-02-26 VITALS — BP 150/80 | HR 97 | Temp 98.4°F | Resp 15 | Ht 65.0 in | Wt 154.0 lb

## 2021-02-26 DIAGNOSIS — K4020 Bilateral inguinal hernia, without obstruction or gangrene, not specified as recurrent: Secondary | ICD-10-CM

## 2021-02-26 NOTE — Progress Notes (Signed)
Subjective:    Patient ID: Michael Zavala, male    DOB: 22-Jan-1971, 50 y.o.   MRN: [id number]  HPI  Patient is a very pleasant 50 year old Caucasian male here today requesting an evaluation for hernia.  He is the bulge in his pelvic area several months ago.  Is progressively gotten larger.  On examination today he has a large right-sided inguinal hernia that is freely reducible.  However he also has a small left-sided inguinal hernia.  He denies any change in bowel habits.  He denies any melena or hematochezia.  He denies any abdominal pain.  The hernias are reducible.  They are nontender.  There is no evidence of incarceration.  Patient is extremely healthy and has no other medical risk factors that would prevent surgery.  He is due for a physical at any point. Past Medical History:  Diagnosis Date  . PVC (premature ventricular contraction)   . White coat syndrome without diagnosis of hypertension    No past surgical history on file. Current Outpatient Medications on File Prior to Visit  Medication Sig Dispense Refill  . HYDROcodone-homatropine (HYCODAN) 5-1.5 MG/5ML syrup Take 5 mLs by mouth every 8 (eight) hours as needed for cough. (Patient not taking: Reported on 02/26/2021) 120 mL 0  . MULTIPLE VITAMINS PO Take by mouth. (Patient not taking: Reported on 02/26/2021)    . Omega-3 Fatty Acids (FISH OIL) 1200 MG CAPS Take by mouth. 2 tab po bid (Patient not taking: Reported on 02/26/2021)     No current facility-administered medications on file prior to visit.   No Known Allergies Social History   Socioeconomic History  . Marital status: Divorced    Spouse name: Not on file  . Number of children: Not on file  . Years of education: Not on file  . Highest education level: Not on file  Occupational History  . Not on file  Tobacco Use  . Smoking status: Never Smoker  . Smokeless tobacco: Never Used  Substance and Sexual Activity  . Alcohol use: Yes    Comment: Rare  . Drug use: No  .  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  Other Topics Concern  . Not on file  Social History Narrative  . Not on file   Social Determinants of Health   Financial Resource Strain: Not on file  Food Insecurity: Not on file  Transportation Needs: Not on file  Physical Activity: Not on file  Stress: Not on file  Social Connections: Not on file  Intimate Partner Violence: Not on file     Review of Systems  All other systems reviewed and are negative.      Objective:   Physical Exam Vitals reviewed.  Constitutional:      Appearance: Normal appearance. He is normal weight.  Cardiovascular:     Rate and Rhythm: Normal rate and regular rhythm.     Heart sounds: Normal heart sounds.  Pulmonary:     Effort: Pulmonary effort is normal.  Abdominal:     General: Bowel sounds are normal.     Tenderness: There is no abdominal tenderness.     Hernia: A hernia is present. Hernia is present in the left inguinal area and right inguinal area.  Neurological:     Mental Status: He is alert.           Assessment & Plan:  Bilateral inguinal hernia without obstruction or gangrene, recurrence not specified - Plan: Ambulatory referral  to General Surgery  Consult general surgery to repair his inguinal hernias.  Patient does have elevated blood pressure today but he also has whitecoat syndrome.  Schedule complete physical exam at his earliest convenience for fasting lab work.

## 2021-04-24 NOTE — Patient Instructions (Signed)
DUE TO COVID-19 ONLY ONE VISITOR IS ALLOWED TO COME WITH YOU AND STAY IN THE WAITING ROOM ONLY DURING PRE OP AND PROCEDURE DAY OF SURGERY. THE 1 VISITOR  MAY VISIT WITH YOU AFTER SURGERY IN YOUR PRIVATE ROOM DURING VISITING HOURS ONLY!  YOU NEED TO HAVE A COVID 19 TEST ON: 04/26/21 @ 11:30 AM, THIS TEST MUST BE DONE BEFORE SURGERY,  COVID TESTING SITE [address] [place] [address], IT IS ON THE RIGHT GOING OUT [address] APPROXIMATELY  2 MINUTES PAST [employer] ON THE RIGHT. ONCE YOUR COVID TEST IS COMPLETED,  PLEASE BEGIN THE QUARANTINE INSTRUCTIONS AS OUTLINED IN YOUR HANDOUT.                Michael Zavala   Your procedure is scheduled on: 04/30/21   Report to [facility] Main  Entrance   Report to admitting at: 9:00 AM     Call this number if you have problems the morning of surgery [phone]    Remember: Do not eat solid food :After Midnight. Clear liquids until: 8:00 am  CLEAR LIQUID DIET  Foods Allowed                                                                     Foods Excluded  Coffee and tea, regular and decaf                             liquids that you cannot  Plain Jell-O any favor except red or purple                                           see through such as: Fruit ices (not with fruit pulp)                                     milk, soups, orange juice  Iced Popsicles                                    All solid food Carbonated beverages, regular and diet                                    Cranberry, grape and apple juices Sports drinks like Gatorade Lightly seasoned clear broth or consume(fat free) Sugar, honey syrup  Sample Menu Breakfast                                Lunch                                     Supper Cranberry juice  Beef broth                            Chicken broth Jell-O                                     Grape juice                           Apple juice Coffee or tea                         Jell-O                                      Popsicle                                                Coffee or tea                        Coffee or tea  _____________________________________________________________________  BRUSH YOUR TEETH MORNING OF SURGERY AND RINSE YOUR MOUTH OUT, NO CHEWING GUM CANDY OR MINTS.                               You may not have any metal on your body including hair pins and              piercings  Do not wear jewelry, lotions, powders or perfumes, deodorant             Men may shave face and neck.   Do not bring valuables to the hospital. [place].  Contacts, dentures or bridgework may not be worn into surgery.  Leave suitcase in the car. After surgery it may be brought to your room.     Patients discharged the day of surgery will not be allowed to drive home. IF YOU ARE HAVING SURGERY AND GOING HOME THE SAME DAY, YOU MUST HAVE AN ADULT TO DRIVE YOU HOME AND BE WITH YOU FOR 24 HOURS. YOU MAY GO HOME BY TAXI OR UBER OR ORTHERWISE, BUT AN ADULT MUST ACCOMPANY YOU HOME AND STAY WITH YOU FOR 24 HOURS.  Name and phone number of your driver:  Special Instructions: N/A              Please read over the following fact sheets you were given: _____________________________________________________________________         [facility] - Preparing for Surgery Before surgery, you can play an important role.  Because skin is not sterile, your skin needs to be as free of germs as possible.  You can reduce the number of germs on your skin by washing with CHG (chlorahexidine gluconate) soap before surgery.  CHG is an antiseptic cleaner which kills germs and bonds with the skin to continue killing germs even after washing. Please DO NOT use if you have an allergy to CHG or antibacterial soaps.  If your skin becomes reddened/irritated stop using the CHG and inform your nurse when you arrive at Short Stay. Do not shave  (including legs and underarms) for at least 48 hours prior to the first CHG shower.  You may shave your face/neck. Please follow these instructions carefully:  1.  Shower with CHG Soap the night before surgery and the  morning of Surgery.  2.  If you choose to wash your hair, wash your hair first as usual with your  normal  shampoo.  3.  After you shampoo, rinse your hair and body thoroughly to remove the  shampoo.                           4.  Use CHG as you would any other liquid soap.  You can apply chg directly  to the skin and wash                       Gently with a scrungie or clean washcloth.  5.  Apply the CHG Soap to your body ONLY FROM THE NECK DOWN.   Do not use on face/ open                           Wound or open sores. Avoid contact with eyes, ears mouth and genitals (private parts).                       Wash face,  Genitals (private parts) with your normal soap.             6.  Wash thoroughly, paying special attention to the area where your surgery  will be performed.  7.  Thoroughly rinse your body with warm water from the neck down.  8.  DO NOT shower/wash with your normal soap after using and rinsing off  the CHG Soap.                9.  Pat yourself dry with a clean towel.            10.  Wear clean pajamas.            11.  Place clean sheets on your bed the night of your first shower and do not  sleep with pets. Day of Surgery : Do not apply any lotions/deodorants the morning of surgery.  Please wear clean clothes to the hospital/surgery center.  FAILURE TO FOLLOW THESE INSTRUCTIONS MAY RESULT IN THE CANCELLATION OF YOUR SURGERY PATIENT SIGNATURE_________________________________  NURSE SIGNATURE__________________________________  ________________________________________________________________________

## 2021-04-24 NOTE — Progress Notes (Signed)
Pt. Needs orders for upcomming surgery.PAT and labs on 04/25/21.

## 2021-04-25 ENCOUNTER — Encounter (HOSPITAL_COMMUNITY)
Admission: RE | Admit: 2021-04-25 | Discharge: 2021-04-25 | Disposition: A | Discharge status: Home / Self Care | Payer: 59 | Source: Ambulatory Visit | Attending: Surgery | Admitting: Surgery

## 2021-04-25 ENCOUNTER — Other Ambulatory Visit: Payer: Self-pay

## 2021-04-25 ENCOUNTER — Encounter (HOSPITAL_COMMUNITY): Payer: Self-pay

## 2021-04-25 DIAGNOSIS — Z01818 Encounter for other preprocedural examination: Secondary | ICD-10-CM | POA: Diagnosis present

## 2021-04-25 LAB — CBC
HCT: 49.2 % (ref 39.0–52.0)
Hemoglobin: 16.6 g/dL (ref 13.0–17.0)
MCH: 30.7 pg (ref 26.0–34.0)
MCHC: 33.7 g/dL (ref 30.0–36.0)
MCV: 90.9 fL (ref 80.0–100.0)
Platelets: 194 10*3/uL (ref 150–400)
RBC: 5.41 MIL/uL (ref 4.22–5.81)
RDW: 13 % (ref 11.5–15.5)
WBC: 5.7 10*3/uL (ref 4.0–10.5)
nRBC: 0 % (ref 0.0–0.2)

## 2021-04-25 LAB — BASIC METABOLIC PANEL
Anion gap: 10 (ref 5–15)
BUN: 21 mg/dL — ABNORMAL HIGH (ref 6–20)
CO2: 25 mmol/L (ref 22–32)
Calcium: 9.6 mg/dL (ref 8.9–10.3)
Chloride: 105 mmol/L (ref 98–111)
Creatinine, Ser: 0.75 mg/dL (ref 0.61–1.24)
GFR, Estimated: >60 mL/min (ref >60)
Glucose, Bld: 94 mg/dL (ref 70–99)
Potassium: 3.9 mmol/L (ref 3.5–5.1)
Sodium: 140 mmol/L (ref 135–145)

## 2021-04-25 NOTE — Progress Notes (Signed)
COVID Vaccine Completed: NO Date COVID Vaccine completed: COVID vaccine manufacturer: [employer]   PCP - Dr. Lynnea Ferrier. LOV: 02/26/21 Cardiologist -   Chest x-ray -  EKG -  Stress Test -  ECHO -  Cardiac Cath -  Pacemaker/ICD device last checked:  Sleep Study -  CPAP -   Fasting Blood Sugar -  Checks Blood Sugar _____ times a day  Blood Thinner Instructions: Aspirin Instructions: Last Dose:  Anesthesia review: Hx: HTN,PVC's  Patient denies shortness of breath, fever, cough and chest pain at PAT appointment   Patient verbalized understanding of instructions that were given to them at the PAT appointment. Patient was also instructed that they will need to review over the PAT instructions again at home before surgery.

## 2021-04-26 ENCOUNTER — Other Ambulatory Visit (HOSPITAL_COMMUNITY)
Admission: RE | Admit: 2021-04-26 | Discharge: 2021-04-26 | Disposition: A | Discharge status: Home / Self Care | Payer: 59 | Source: Ambulatory Visit | Attending: Surgery | Admitting: Surgery

## 2021-04-26 DIAGNOSIS — Z01812 Encounter for preprocedural laboratory examination: Secondary | ICD-10-CM | POA: Diagnosis present

## 2021-04-26 DIAGNOSIS — Z20822 Contact with and (suspected) exposure to covid-19: Secondary | ICD-10-CM | POA: Insufficient documentation

## 2021-04-26 LAB — SARS CORONAVIRUS 2 (TAT 6-24 HRS): SARS Coronavirus 2: NEGATIVE

## 2021-04-27 ENCOUNTER — Encounter (HOSPITAL_COMMUNITY): Payer: Self-pay | Admitting: Surgery

## 2021-04-27 NOTE — H&P (Signed)
Michael Zavala Location: [facility] Patient #: [id number] DOB: 11/06/1971 Single / Language: [place] / Race: White Male   History of Present Illness The patient is a 50 year old male who presents with an inguinal hernia. The hernia(s) is/are located on both sides. He has noticed these bulges in his groin. The right side is more prominent than the left although they are both reducible.  He works as an Lexicographer  I gave him a booklet on laparoscopic tAPP hernia repairs and I explained it to him and his mother in some detail. I showed him where the trocar placements would be. I discussed the placement of mesh. I think that a laparoscopic or robotic approach would be preferable in this bilateral situation is both sides are fairly prominent but the right is bigger than left. I'll schedule him in early May on the XI robot at [facility].   Past Surgical History   Diagnostic Studies History  Colonoscopy  1-5 years ago  Allergies  No Known Allergies  [03/28/2021]:  Medication History Multivitamins (Oral) Active. Medications Reconciled Fish Oil (1200MG  Capsule, Oral) Active.  Social History  Alcohol use  Remotely quit alcohol use. Caffeine use  Coffee, Tea. No drug use  Tobacco use  Never smoker.  Family History Migraine Headache  Sister.  Other Problems  No pertinent past medical history   Review of Systems  General Not Present- Appetite Loss, Chills, Fatigue, Fever, Night Sweats, Weight Gain and Weight Loss. Skin Not Present- Change in Wart/Mole, Dryness, Hives, Jaundice, New Lesions, Non-Healing Wounds, Rash and Ulcer. HEENT Not Present- Earache, Hearing Loss, Hoarseness, Nose Bleed, Oral Ulcers, Ringing in the Ears, Seasonal Allergies, Sinus Pain, Sore Throat, Visual Disturbances, Wears glasses/contact lenses and Yellow Eyes. Respiratory Not Present- Bloody sputum, Chronic Cough, Difficulty Breathing, Snoring and Wheezing. Breast Not Present- Breast Mass,  Breast Pain, Nipple Discharge and Skin Changes. Cardiovascular Not Present- Chest Pain, Difficulty Breathing Lying Down, Leg Cramps, Palpitations, Rapid Heart Rate, Shortness of Breath and Swelling of Extremities. Gastrointestinal Not Present- Abdominal Pain, Bloating, Bloody Stool, Change in Bowel Habits, Chronic diarrhea, Constipation, Difficulty Swallowing, Excessive gas, Gets full quickly at meals, Hemorrhoids, Indigestion, Nausea, Rectal Pain and Vomiting. Male Genitourinary Not Present- Blood in Urine, Change in Urinary Stream, Frequency, Impotence, Nocturia, Painful Urination, Urgency and Urine Leakage. Musculoskeletal Not Present- Back Pain, Joint Pain, Joint Stiffness, Muscle Pain, Muscle Weakness and Swelling of Extremities. Neurological Not Present- Decreased Memory, Fainting, Headaches, Numbness, Seizures, Tingling, Tremor, Trouble walking and Weakness. Psychiatric Not Present- Anxiety, Bipolar, Change in Sleep Pattern, Depression, Fearful and Frequent crying. Endocrine Not Present- Cold Intolerance, Excessive Hunger, Hair Changes, Heat Intolerance, Hot flashes and New Diabetes. Hematology Not Present- Blood Thinners, Easy Bruising, Excessive bleeding, Gland problems, HIV and Persistent Infections.  Weight: 154.25 lb Height: 66in Body Surface Area: 1.79 m Body Mass Index: 24.9 kg/m  Pulse: 111 (Regular)  P.OX: 96% (Room air) Physical Exam   well-developed well-nourished white male in no acute distress HEENT unremarkable except for glasses Neck is supple, nontender Chest is clear to auscultation Heart sinus rhythm without murmurs or gallops Abdomen/Pelvis bilateral herniae with the right greater than the left.-reducible Extremity exam for range of motion Neuro alert and oriented 3. Motor and sensory function grossly intact.   Assessment & Plan  BILATERAL INGUINAL HERNIA (K40.20) Impression: bilateral inguinal hernias. I have explained laparoscopic/robotic TAPP and TEP   Procedures to him. he understands indications and risks. I gave him a [employer]  booklet on hernia repair as  well.  We discussed placement of a Foley to keep the bladder reduced in the preop holding area.  Will proceed with TEP hernia repair.     Matt B. Daphine Deutscher, MD, FACS

## 2021-04-30 ENCOUNTER — Ambulatory Visit (HOSPITAL_COMMUNITY): Payer: 59 | Admitting: Anesthesiology

## 2021-04-30 ENCOUNTER — Encounter (HOSPITAL_COMMUNITY)
Admission: RE | Disposition: A | Discharge status: Home / Self Care | Payer: Self-pay | Source: Home / Self Care | Attending: Surgery

## 2021-04-30 ENCOUNTER — Encounter (HOSPITAL_COMMUNITY): Payer: Self-pay | Admitting: Surgery

## 2021-04-30 ENCOUNTER — Ambulatory Visit (HOSPITAL_COMMUNITY)
Admission: RE | Admit: 2021-04-30 | Discharge: 2021-04-30 | Disposition: A | Discharge status: Home / Self Care | Payer: 59 | Attending: Surgery | Admitting: Surgery

## 2021-04-30 ENCOUNTER — Ambulatory Visit (HOSPITAL_COMMUNITY): Payer: 59 | Admitting: Physician Assistant

## 2021-04-30 DIAGNOSIS — K4020 Bilateral inguinal hernia, without obstruction or gangrene, not specified as recurrent: Secondary | ICD-10-CM | POA: Insufficient documentation

## 2021-04-30 HISTORY — PX: INGUINAL HERNIA REPAIR: SHX194

## 2021-04-30 HISTORY — PX: INSERTION OF MESH: SHX5868

## 2021-04-30 SURGERY — REPAIR, HERNIA, INGUINAL, LAPAROSCOPIC
Anesthesia: General | Site: Groin | Laterality: Bilateral

## 2021-04-30 MED ORDER — PROPOFOL 10 MG/ML IV BOLUS
INTRAVENOUS | Status: DC | PRN
  Administered 2021-04-30: 150 mg via INTRAVENOUS

## 2021-04-30 MED ORDER — HYDROCODONE-ACETAMINOPHEN 5-325 MG PO TABS: 1.0000 | ORAL_TABLET | Freq: Four times a day (QID) | ORAL | 0 refills | Status: DC | PRN

## 2021-04-30 MED ORDER — ROCURONIUM BROMIDE 10 MG/ML (PF) SYRINGE
PREFILLED_SYRINGE | INTRAVENOUS | Status: DC | PRN
  Administered 2021-04-30: 50 mg via INTRAVENOUS

## 2021-04-30 MED ORDER — LACTATED RINGERS IV SOLN: INTRAVENOUS | Status: DC

## 2021-04-30 MED ORDER — LACTATED RINGERS IR SOLN
Status: DC | PRN
  Administered 2021-04-30: 1000 mL

## 2021-04-30 MED ORDER — CHLORHEXIDINE GLUCONATE CLOTH 2 % EX PADS: 6.0000 | MEDICATED_PAD | Freq: Once | CUTANEOUS | Status: DC

## 2021-04-30 MED ORDER — FENTANYL CITRATE (PF) 100 MCG/2ML IJ SOLN: 25.0000 ug | INTRAMUSCULAR | Status: DC | PRN

## 2021-04-30 MED ORDER — FENTANYL CITRATE (PF) 250 MCG/5ML IJ SOLN
INTRAMUSCULAR | Status: AC
  Filled 2021-04-30: qty 5

## 2021-04-30 MED ORDER — CEFAZOLIN SODIUM-DEXTROSE 2-4 GM/100ML-% IV SOLN
2.0000 g | INTRAVENOUS | Status: AC
  Administered 2021-04-30: 2 g via INTRAVENOUS
  Filled 2021-04-30: qty 100

## 2021-04-30 MED ORDER — HEPARIN SODIUM (PORCINE) 5000 UNIT/ML IJ SOLN
5000.0000 [IU] | Freq: Once | INTRAMUSCULAR | Status: AC
  Administered 2021-04-30: 5000 [IU] via SUBCUTANEOUS
  Filled 2021-04-30: qty 1

## 2021-04-30 MED ORDER — PROPOFOL 10 MG/ML IV BOLUS
INTRAVENOUS | Status: AC
  Filled 2021-04-30: qty 20

## 2021-04-30 MED ORDER — LIDOCAINE 2% (20 MG/ML) 5 ML SYRINGE
INTRAMUSCULAR | Status: DC | PRN
  Administered 2021-04-30: 60 mg via INTRAVENOUS

## 2021-04-30 MED ORDER — 0.9 % SODIUM CHLORIDE (POUR BTL) OPTIME
TOPICAL | Status: DC | PRN
  Administered 2021-04-30: 1000 mL

## 2021-04-30 MED ORDER — LIDOCAINE 2% (20 MG/ML) 5 ML SYRINGE
INTRAMUSCULAR | Status: AC
  Filled 2021-04-30: qty 5

## 2021-04-30 MED ORDER — FENTANYL CITRATE (PF) 100 MCG/2ML IJ SOLN
INTRAMUSCULAR | Status: DC | PRN
  Administered 2021-04-30: 50 ug via INTRAVENOUS
  Administered 2021-04-30 (×2): 100 ug via INTRAVENOUS

## 2021-04-30 MED ORDER — ROCURONIUM BROMIDE 10 MG/ML (PF) SYRINGE
PREFILLED_SYRINGE | INTRAVENOUS | Status: AC
  Filled 2021-04-30: qty 10

## 2021-04-30 MED ORDER — MIDAZOLAM HCL 5 MG/5ML IJ SOLN
INTRAMUSCULAR | Status: DC | PRN
  Administered 2021-04-30: 2 mg via INTRAVENOUS

## 2021-04-30 MED ORDER — CHLORHEXIDINE GLUCONATE 0.12 % MT SOLN
15.0000 mL | Freq: Once | OROMUCOSAL | Status: AC
  Administered 2021-04-30: 15 mL via OROMUCOSAL

## 2021-04-30 MED ORDER — FENTANYL CITRATE (PF) 100 MCG/2ML IJ SOLN
INTRAMUSCULAR | Status: AC
  Filled 2021-04-30: qty 2

## 2021-04-30 MED ORDER — BUPIVACAINE-EPINEPHRINE (PF) 0.25% -1:200000 IJ SOLN
INTRAMUSCULAR | Status: AC
  Filled 2021-04-30: qty 30

## 2021-04-30 MED ORDER — DEXAMETHASONE SODIUM PHOSPHATE 10 MG/ML IJ SOLN
INTRAMUSCULAR | Status: AC
  Filled 2021-04-30: qty 1

## 2021-04-30 MED ORDER — MIDAZOLAM HCL 2 MG/2ML IJ SOLN
INTRAMUSCULAR | Status: AC
  Filled 2021-04-30: qty 2

## 2021-04-30 MED ORDER — BUPIVACAINE LIPOSOME 1.3 % IJ SUSP
20.0000 mL | Freq: Once | INTRAMUSCULAR | Status: AC
  Administered 2021-04-30: 20 mL
  Filled 2021-04-30: qty 20

## 2021-04-30 MED ORDER — ONDANSETRON HCL 4 MG/2ML IJ SOLN
INTRAMUSCULAR | Status: AC
  Filled 2021-04-30: qty 2

## 2021-04-30 MED ORDER — ORAL CARE MOUTH RINSE: 15.0000 mL | Freq: Once | OROMUCOSAL | Status: AC

## 2021-04-30 MED ORDER — SUGAMMADEX SODIUM 200 MG/2ML IV SOLN
INTRAVENOUS | Status: DC | PRN
  Administered 2021-04-30: 200 mg via INTRAVENOUS

## 2021-04-30 MED ORDER — DEXAMETHASONE SODIUM PHOSPHATE 4 MG/ML IJ SOLN
INTRAMUSCULAR | Status: DC | PRN
  Administered 2021-04-30: 5 mg via INTRAVENOUS

## 2021-04-30 SURGICAL SUPPLY — 36 items
BENZOIN TINCTURE PRP APPL 2/3 (GAUZE/BANDAGES/DRESSINGS) ×2 IMPLANT
BLADE HEX COATED 2.75 (ELECTRODE) ×2 IMPLANT
CABLE HIGH FREQUENCY MONO STRZ (ELECTRODE) ×2 IMPLANT
CATH FOLEY 2WAY SLVR  5CC 14FR (CATHETERS) ×1
CATH FOLEY 2WAY SLVR 5CC 14FR (CATHETERS) ×1 IMPLANT
COVER SURGICAL LIGHT HANDLE (MISCELLANEOUS) ×2 IMPLANT
COVER WAND RF STERILE (DRAPES) IMPLANT
DECANTER SPIKE VIAL GLASS SM (MISCELLANEOUS) ×2 IMPLANT
DEVICE SECURE STRAP 25 ABSORB (INSTRUMENTS) IMPLANT
DISSECT BALLN SPACEMKR + OVL (BALLOONS) ×2
DISSECTOR BALLN SPACEMKR + OVL (BALLOONS) ×1 IMPLANT
DISSECTOR BLUNT TIP ENDO 5MM (MISCELLANEOUS) ×2 IMPLANT
ELECT PENCIL ROCKER SW 15FT (MISCELLANEOUS) ×2 IMPLANT
ELECT REM PT RETURN 15FT ADLT (MISCELLANEOUS) ×2 IMPLANT
GLOVE BIOGEL M 8.0 STRL (GLOVE) ×2 IMPLANT
GOWN STRL REUS W/TWL XL LVL3 (GOWN DISPOSABLE) ×6 IMPLANT
KIT BASIN OR (CUSTOM PROCEDURE TRAY) ×2 IMPLANT
KIT TURNOVER KIT A (KITS) ×2 IMPLANT
MESH 3DMAX 4X6 LT LRG (Mesh General) ×2 IMPLANT
MESH 3DMAX 4X6 RT LRG (Mesh General) ×2 IMPLANT
PAD POSITIONING PINK XL (MISCELLANEOUS) ×2 IMPLANT
SCISSORS LAP 5X35 DISP (ENDOMECHANICALS) IMPLANT
SET IRRIG TUBING LAPAROSCOPIC (IRRIGATION / IRRIGATOR) ×2 IMPLANT
SET TUBE SMOKE EVAC HIGH FLOW (TUBING) ×2 IMPLANT
SLEEVE ADV FIXATION 5X100MM (TROCAR) ×2 IMPLANT
SOL ANTI FOG 6CC (MISCELLANEOUS) ×1 IMPLANT
SOLUTION ANTI FOG 6CC (MISCELLANEOUS) ×1
SUT VIC AB 3-0 SH 27 (SUTURE)
SUT VIC AB 3-0 SH 27XBRD (SUTURE) IMPLANT
SUT VIC AB 4-0 SH 18 (SUTURE) ×2 IMPLANT
SYR 10ML ECCENTRIC (SYRINGE) ×2 IMPLANT
SYR 30ML LL (SYRINGE) ×2 IMPLANT
TACKER 5MM HERNIA 3.5CML NAB (ENDOMECHANICALS) ×2 IMPLANT
TRAY FOLEY MTR SLVR 16FR STAT (SET/KITS/TRAYS/PACK) IMPLANT
TRAY LAPAROSCOPIC (CUSTOM PROCEDURE TRAY) ×2 IMPLANT
TROCAR ADV FIXATION 5X100MM (TROCAR) ×2 IMPLANT

## 2021-04-30 NOTE — Anesthesia Procedure Notes (Signed)
Procedure Name: Intubation Date/Time: 04/30/2021 11:08 AM Performed by: Claudia Desanctis, CRNA Pre-anesthesia Checklist: Patient identified, Emergency Drugs available, Suction available and Patient being monitored Patient Re-evaluated:Patient Re-evaluated prior to induction Oxygen Delivery Method: Circle system utilized Preoxygenation: Pre-oxygenation with 100% oxygen Induction Type: IV induction Ventilation: Mask ventilation without difficulty Laryngoscope Size: Mac and 4 Grade View: Grade I Tube type: Oral Tube size: 7.5 mm Number of attempts: 1 Airway Equipment and Method: Stylet Placement Confirmation: ETT inserted through vocal cords under direct vision,  positive ETCO2 and breath sounds checked- equal and bilateral Secured at: 21 cm Tube secured with: Tape Dental Injury: Teeth and Oropharynx as per pre-operative assessment

## 2021-04-30 NOTE — Anesthesia Postprocedure Evaluation (Signed)
Anesthesia Post Note  Patient: Michael Zavala  Procedure(s) Performed: BILATERAL LAPAROSCOPIC INGUINAL HERNIA REPAIRS (Bilateral Groin) INSERTION OF MESH (Bilateral Groin)     Patient location during evaluation: PACU Anesthesia Type: General Level of consciousness: awake Pain management: pain level controlled Vital Signs Assessment: post-procedure vital signs reviewed and stable Respiratory status: spontaneous breathing Cardiovascular status: stable Postop Assessment: no apparent nausea or vomiting Anesthetic complications: no   No complications documented.  Last Vitals:  Vitals:   04/30/21 1355 04/30/21 1412  BP: (!) 152/79 128/84  Pulse: 90 74  Resp: 12   Temp: 36.5 C 36.4 C  SpO2: 96% 97%    Last Pain:  Vitals:   04/30/21 1412  TempSrc: Oral  PainSc: 0-No pain                 Darin Arndt

## 2021-04-30 NOTE — Discharge Instructions (Signed)
Laparoscopic Inguinal Hernia Repair, Adult, Care After The following information offers guidance on how to care for yourself after your procedure. Your health care provider may also give you more specific instructions. If you have problems or questions, contact your health care provider. What can I expect after the procedure? After the procedure, it is common to have:  Pain.  Swelling and bruising around the incision area.  Scrotal swelling, in males.  Some fluid or blood draining from your incisions. Follow these instructions at home: Medicines  Take over-the-counter and prescription medicines only as told by your health care provider.  Ask your health care provider if the medicine prescribed to you: ? Requires you to avoid driving or using machinery. ? Can cause constipation. You may need to take these actions to prevent or treat constipation:  Drink enough fluid to keep your urine pale yellow.  Take over-the-counter or prescription medicines.  Eat foods that are high in fiber, such as beans, whole grains, and fresh fruits and vegetables.  Limit foods that are high in fat and processed sugars, such as fried or sweet foods. Incision care  Follow instructions from your health care provider about how to take care of your incisions. Make sure you: ? Wash your hands with soap and water for at least 20 seconds before and after you change your bandage (dressing). If soap and water are not available, use hand sanitizer. ? Change your dressing as told by your health care provider. ? Leave stitches (sutures), skin glue, or adhesive strips in place. These skin closures may need to stay in place for 2 weeks or longer. If adhesive strip edges start to loosen and curl up, you may trim the loose edges. Do not remove adhesive strips completely unless your health care provider tells you to do that.  Check your incision area every day for signs of infection. Check for: ? More redness, swelling,  or pain. ? More fluid or blood. ? Warmth. ? Pus or a bad smell.  Wear loose, soft clothing while your incisions heal.   Managing pain and swelling If directed, put ice on the painful or swollen areas. To do this:  Put ice in a plastic bag.  Place a towel between your skin and the bag.  Leave the ice on for 20 minutes, 2-3 times a day.  Remove the ice if your skin turns bright red. This is very important. If you cannot feel pain, heat, or cold, you have a greater risk of damage to the area.   Activity  Do not lift anything that is heavier than 10 lb (4.5 kg), or the limit that you are told, until your health care provider says that it is safe.  Ask your health care provider what activities are safe for you. A lot of activity during the first week after surgery can increase pain and swelling. For 1 week after your procedure: ? Avoid activities that take a lot of effort, such as exercise or sports. ? You may walk and climb stairs as needed for daily activity, but avoid long walks or climbing stairs for exercise. General instructions  If you were given a sedative during the procedure, it can affect you for several hours. Do not drive or operate machinery until your health care provider says that it is safe.  Do not take baths, swim, or use a hot tub until your health care provider approves. Ask your health care provider if you may take showers. You may only be allowed   to take sponge baths.  Do not use any products that contain nicotine or tobacco. These products include cigarettes, chewing tobacco, and vaping devices, such as e-cigarettes. If you need help quitting, ask your health care provider.  Keep all follow-up visits. This is important. Contact a health care provider if:  You have any of these signs of infection: ? More redness, swelling, or pain around your incisions or your groin area. ? More fluid or blood coming from an incision. ? Warmth coming from an incision. ? Pus or  a bad smell coming from an incision. ? A fever or chills.  You have more swelling in your scrotum, if you are male.  You have severe pain and medicines do not help.  You have abdominal pain or swelling.  You cannot urinate or have a bowel movement.  You faint or feel dizzy.  You have nausea and vomiting. Get help right away if:  You have redness, warmth, or pain in your leg.  You have chest pain.  You have problems breathing. These symptoms may represent a serious problem that is an emergency. Do not wait to see if the symptoms will go away. Get medical help right away. Call your local emergency services (911 in the [place] Do not drive yourself to the hospital. Summary  Pain, swelling, and bruising are common after the procedure.  Check your incision area every day for signs of infection, such as more redness, swelling, or pain.  Put ice on painful or swollen areas for 20 minutes, 2-3 times a day. This information is not intended to replace advice given to you by your health care provider. Make sure you discuss any questions you have with your health care provider. Document Revised: 08/15/2020 Document Reviewed: 08/15/2020 Elsevier Patient Education  2021 Elsevier Inc.  

## 2021-04-30 NOTE — Op Note (Signed)
Bud Kaeser  [birth year]   04/30/2021    PCP:  Donita Brooks, MD   Surgeon: Wenda Low, MD, FACS  Asst:  none  Anes:  general  Preop Dx: Bilateral inguinal herniae Postop Dx: Bilateral direct ingunal herniae  Procedure: Laparoscopic TEP with Right and Left 3 D Max Mesh large Location Surgery: [facility] 1 Complications: None noted  EBL:   minimal cc  Drains: none  Description of Procedure:  The patient was taken to OR 1 .  After anesthesia was administered and the patient was prepped  with chloroprep  and a timeout was performed.  Transverse incision below the umbilicus and a transverse incision in the anterior rectus with the muscle retracted laterally on the right side.  Balloon was inserted and then the scope was placed and it was pumped up to achieve a nice preperitoneal dissection.  The balloon was withdrawn and the cannula was advanced and secured with the balloon on its tip.  Following insufflation blunt dissection was performed with Kitner retractors and with dissector's to reveal bilateral inguinal ligaments without evidence of indirect hernias but with easily visible prominent direct hernias in the floor.  When the anatomy had been a delineated with the scope and then the 2 5 mm ports that I had placed upon entering under laparoscopic vision I placed a piece of 3D max mesh large on the right first since that was the larger side and secured it medially to the pubis and along the midline and then tacked it down along Cooper's ligament and anteriorly.  Similarly a piece of mesh of the left 3D max was placed in place with some overlap in the midline and then secured along the Cooper's ligament and anteriorly where I could palpate the tip.  Mesh placement appeared to be good and I injected the port sites with Exparel 20 cc.  Abdomen was deflated.  We did not have a concomitant pneumoperitoneum.  The Foley catheter that was inserted after induction of anesthesia was withdrawn before  awakening.  After deflation the wounds were closed with 4-0 Vicryl.  A pursestring suture used to close the fascia in the upper midline and then the wounds were closed with 4-0 Vicryl and with 4-0 Monocryl and Dermabond.  The patient tolerated the procedure well and was taken to the PACU in stable condition.     Matt B. Daphine Deutscher, MD, [facility], [place] [phone]

## 2021-04-30 NOTE — Interval H&P Note (Signed)
History and Physical Interval Note:  04/30/2021 10:24 AM  Michael Zavala  has presented today for surgery, with the diagnosis of BILATERAL INGUINAL HERNIAS.  The various methods of treatment have been discussed with the patient and family. After consideration of risks, benefits and other options for treatment, the patient has consented to  Procedure(s): BILATERAL LAPAROSCOPIC INGUINAL HERNIA REPAIRS (Bilateral) INSERTION OF MESH (Bilateral) as a surgical intervention.  The patient's history has been reviewed, patient examined, no change in status, stable for surgery.  I have reviewed the patient's chart and labs.  Questions were answered to the patient's satisfaction.     Valarie Merino

## 2021-04-30 NOTE — Anesthesia Preprocedure Evaluation (Addendum)
Anesthesia Evaluation  Patient identified by MRN, date of birth, ID band Patient awake    Reviewed: Allergy & Precautions, NPO status , Patient's Chart, lab work & pertinent test results  Airway Mallampati: II  TM Distance: >3 FB     Dental   Pulmonary neg pulmonary ROS,    breath sounds clear to auscultation       Cardiovascular hypertension,  Rhythm:Regular Rate:Normal     Neuro/Psych negative neurological ROS     GI/Hepatic Neg liver ROS, History noted Dr. Chilton Si   Endo/Other  negative endocrine ROS  Renal/GU negative Renal ROS     Musculoskeletal negative musculoskeletal ROS (+)   Abdominal   Peds  Hematology   Anesthesia Other Findings   Reproductive/Obstetrics                             Anesthesia Physical Anesthesia Plan  ASA: III  Anesthesia Plan: General   Post-op Pain Management:    Induction: Intravenous  PONV Risk Score and Plan: 2 and Ondansetron, Dexamethasone and Midazolam  Airway Management Planned: Oral ETT  Additional Equipment:   Intra-op Plan:   Post-operative Plan: Extubation in OR  Informed Consent: I have reviewed the patients History and Physical, chart, labs and discussed the procedure including the risks, benefits and alternatives for the proposed anesthesia with the patient or authorized representative who has indicated his/her understanding and acceptance.     Dental advisory given  Plan Discussed with: CRNA and Anesthesiologist  Anesthesia Plan Comments:         Anesthesia Quick Evaluation

## 2021-04-30 NOTE — Transfer of Care (Signed)
Immediate Anesthesia Transfer of Care Note  Patient: Michael Zavala  Procedure(s) Performed: BILATERAL LAPAROSCOPIC INGUINAL HERNIA REPAIRS (Bilateral Groin) INSERTION OF MESH (Bilateral Groin)  Patient Location: PACU  Anesthesia Type:General  Level of Consciousness: awake and patient cooperative  Airway & Oxygen Therapy: Patient Spontanous Breathing and Patient connected to face mask  Post-op Assessment: Report given to RN and Post -op Vital signs reviewed and stable  Post vital signs: Reviewed and stable  Last Vitals:  Vitals Value Taken Time  BP    Temp    Pulse 119 04/30/21 1306  Resp 15 04/30/21 1306  SpO2 100 % 04/30/21 1306  Vitals shown include unvalidated device data.  Last Pain:  Vitals:   04/30/21 0937  TempSrc:   PainSc: 0-No pain         Complications: No complications documented.

## 2021-05-01 ENCOUNTER — Encounter (HOSPITAL_COMMUNITY): Payer: Self-pay | Admitting: Surgery

## 2021-11-07 ENCOUNTER — Other Ambulatory Visit: Payer: Self-pay

## 2021-11-07 ENCOUNTER — Other Ambulatory Visit: Payer: 59

## 2021-12-14 ENCOUNTER — Other Ambulatory Visit: Payer: Self-pay

## 2021-12-14 ENCOUNTER — Other Ambulatory Visit: Payer: 59

## 2021-12-14 DIAGNOSIS — Z13220 Encounter for screening for lipoid disorders: Secondary | ICD-10-CM

## 2021-12-14 DIAGNOSIS — Z0000 Encounter for general adult medical examination without abnormal findings: Secondary | ICD-10-CM

## 2021-12-15 LAB — COMPREHENSIVE METABOLIC PANEL
AG Ratio: 1.7 (calc) (ref 1.0–2.5)
ALT: 32 U/L (ref 9–46)
AST: 22 U/L (ref 10–35)
Albumin: 4.3 g/dL (ref 3.6–5.1)
Alkaline phosphatase (APISO): 39 U/L (ref 35–144)
BUN: 21 mg/dL (ref 7–25)
CO2: 25 mmol/L (ref 20–32)
Calcium: 9.2 mg/dL (ref 8.6–10.3)
Chloride: 106 mmol/L (ref 98–110)
Creat: 0.94 mg/dL (ref 0.70–1.30)
Globulin: 2.5 g/dL (calc) (ref 1.9–3.7)
Glucose, Bld: 86 mg/dL (ref 65–99)
Potassium: 3.9 mmol/L (ref 3.5–5.3)
Sodium: 141 mmol/L (ref 135–146)
Total Bilirubin: 1.1 mg/dL (ref 0.2–1.2)
Total Protein: 6.8 g/dL (ref 6.1–8.1)

## 2021-12-15 LAB — CBC WITH DIFFERENTIAL/PLATELET
Absolute Monocytes: 400 cells/uL (ref 200–950)
Basophils Absolute: 30 cells/uL (ref 0–200)
Basophils Relative: 0.8 %
Eosinophils Absolute: 159 cells/uL (ref 15–500)
Eosinophils Relative: 4.3 %
HCT: 47.1 % (ref 38.5–50.0)
Hemoglobin: 16.1 g/dL (ref 13.2–17.1)
Lymphs Abs: 1132 cells/uL (ref 850–3900)
MCH: 31.2 pg (ref 27.0–33.0)
MCHC: 34.2 g/dL (ref 32.0–36.0)
MCV: 91.3 fL (ref 80.0–100.0)
MPV: 9.6 fL (ref 7.5–12.5)
Monocytes Relative: 10.8 %
Neutro Abs: 1980 cells/uL (ref 1500–7800)
Neutrophils Relative %: 53.5 %
Platelets: 183 10*3/uL (ref 140–400)
RBC: 5.16 10*6/uL (ref 4.20–5.80)
RDW: 12.6 % (ref 11.0–15.0)
Total Lymphocyte: 30.6 %
WBC: 3.7 10*3/uL — ABNORMAL LOW (ref 3.8–10.8)

## 2021-12-15 LAB — LIPID PANEL
Cholesterol: 165 mg/dL (ref <200)
HDL: 47 mg/dL (ref >=40)
LDL Cholesterol (Calc): 100 mg/dL (calc) — ABNORMAL HIGH
Non-HDL Cholesterol (Calc): 118 mg/dL (calc) (ref <130)
Total CHOL/HDL Ratio: 3.5 (calc) (ref <5.0)
Triglycerides: 85 mg/dL (ref <150)

## 2021-12-25 ENCOUNTER — Other Ambulatory Visit: Payer: Self-pay

## 2021-12-25 ENCOUNTER — Ambulatory Visit (INDEPENDENT_AMBULATORY_CARE_PROVIDER_SITE_OTHER): Payer: 59 | Admitting: Family Medicine

## 2021-12-25 ENCOUNTER — Encounter: Payer: Self-pay | Admitting: Family Medicine

## 2021-12-25 VITALS — BP 150/80 | HR 130 | Temp 99.1°F | Ht 64.0 in | Wt 153.8 lb

## 2021-12-25 DIAGNOSIS — R030 Elevated blood-pressure reading, without diagnosis of hypertension: Secondary | ICD-10-CM | POA: Diagnosis not present

## 2021-12-25 DIAGNOSIS — Z0000 Encounter for general adult medical examination without abnormal findings: Secondary | ICD-10-CM

## 2021-12-25 DIAGNOSIS — Z0001 Encounter for general adult medical examination with abnormal findings: Secondary | ICD-10-CM | POA: Diagnosis not present

## 2021-12-25 MED ORDER — TERBINAFINE HCL 250 MG PO TABS: 250.0000 mg | ORAL_TABLET | Freq: Every day | ORAL | 2 refills | Status: DC

## 2021-12-25 NOTE — Progress Notes (Signed)
Subjective:    Patient ID: Michael Zavala, male    DOB: 11/20/1971, 50 y.o.   MRN: [id number]  HPI Patient is a very pleasant 50 year old Caucasian gentleman who is here today for complete physical exam.  Since I last saw the patient, he had hernia surgery repair.  He has done well postoperatively and has had no long-term side effects from this.  Overall he is doing very well.  He is due for tetanus shot, flu shot, shingles vaccine, and a COVID booster.  We discussed all these vaccinations at length.  At the present time he politely defers this.  His colonoscopy was in 2020.  He is not due for another colonoscopy until 2030.  He is due for PSA.  Unfortunately we do not get this on his lab work but we will add this today.  His blood pressure today and his heart rate is extremely high.  However the patient has a well-documented history of whitecoat syndrome.  He checks his blood pressure frequently and properly at home with an arm on blood pressure cuff and he never has high blood pressure at home.  This is definitely whitecoat syndrome.  He is very good about rechecking his blood pressure and notifying us of the values.  Appointment on 12/14/2021  Component Date Value Ref Range Status   WBC 12/14/2021 3.7 (L)  3.8 - 10.8 Thousand/uL Final   RBC 12/14/2021 5.16  4.20 - 5.80 Million/uL Final   Hemoglobin 12/14/2021 16.1  13.2 - 17.1 g/dL Final   HCT 51/76/1607 47.1  38.5 - 50.0 % Final   MCV 12/14/2021 91.3  80.0 - 100.0 fL Final   MCH 12/14/2021 31.2  27.0 - 33.0 pg Final   MCHC 12/14/2021 34.2  32.0 - 36.0 g/dL Final   RDW 37/09/6268 12.6  11.0 - 15.0 % Final   Platelets 12/14/2021 183  140 - 400 Thousand/uL Final   MPV 12/14/2021 9.6  7.5 - 12.5 fL Final   Neutro Abs 12/14/2021 1,980  1,500 - 7,800 cells/uL Final   Lymphs Abs 12/14/2021 1,132  850 - 3,900 cells/uL Final   Absolute Monocytes 12/14/2021 400  200 - 950 cells/uL Final   Eosinophils Absolute 12/14/2021 159  15 - 500 cells/uL Final    Basophils Absolute 12/14/2021 30  0 - 200 cells/uL Final   Neutrophils Relative % 12/14/2021 53.5  % Final   Total Lymphocyte 12/14/2021 30.6  % Final   Monocytes Relative 12/14/2021 10.8  % Final   Eosinophils Relative 12/14/2021 4.3  % Final   Basophils Relative 12/14/2021 0.8  % Final   Glucose, Bld 12/14/2021 86  65 - 99 mg/dL Final   Comment: .            Fasting reference interval .    BUN 12/14/2021 21  7 - 25 mg/dL Final   Creat 48/54/6270 0.94  0.70 - 1.30 mg/dL Final   BUN/Creatinine Ratio 12/14/2021 NOT APPLICABLE  6 - 22 (calc) Final   Sodium 12/14/2021 141  135 - 146 mmol/L Final   Potassium 12/14/2021 3.9  3.5 - 5.3 mmol/L Final   Chloride 12/14/2021 106  98 - 110 mmol/L Final   CO2 12/14/2021 25  20 - 32 mmol/L Final   Calcium 12/14/2021 9.2  8.6 - 10.3 mg/dL Final   Total Protein 35/00/9381 6.8  6.1 - 8.1 g/dL Final   Albumin 82/99/3716 4.3  3.6 - 5.1 g/dL Final   Globulin 96/78/9381 2.5  1.9 - 3.7 g/dL (calc)  Final   AG Ratio 12/14/2021 1.7  1.0 - 2.5 (calc) Final   Total Bilirubin 12/14/2021 1.1  0.2 - 1.2 mg/dL Final   Alkaline phosphatase (APISO) 12/14/2021 39  35 - 144 U/L Final   AST 12/14/2021 22  10 - 35 U/L Final   ALT 12/14/2021 32  9 - 46 U/L Final   Cholesterol 12/14/2021 165  <200 mg/dL Final   HDL 16/09/9603 47  > OR = 40 mg/dL Final   Triglycerides 54/08/8118 85  <150 mg/dL Final   LDL Cholesterol (Calc) 12/14/2021 100 (H)  mg/dL (calc) Final   Comment: Reference range: <100 . Desirable range <100 mg/dL for primary prevention;   <70 mg/dL for patients with CHD or diabetic patients  with > or = 2 CHD risk factors. Marland Kitchen LDL-C is now calculated using the Martin-Hopkins  calculation, which is a validated novel method providing  better accuracy than the Friedewald equation in the  estimation of LDL-C.  Horald Pollen et al. Lenox Ahr. 1478;295(62): 2061-2068  (http://education.[facility].com/faq/FAQ164)    Total CHOL/HDL Ratio 12/14/2021 3.5  <1.3 (calc)  Final   Non-HDL Cholesterol (Calc) 12/14/2021 118  <130 mg/dL (calc) Final   Comment: For patients with diabetes plus 1 major ASCVD risk  factor, treating to a non-HDL-C goal of <100 mg/dL  (LDL-C of <08 mg/dL) is considered a therapeutic  option.      Past Medical History:  Diagnosis Date   PVC (premature ventricular contraction)    White coat syndrome without diagnosis of hypertension    Social History   Socioeconomic History   Marital status: Divorced    Spouse name: Not on file   Number of children: Not on file   Years of education: Not on file   Highest education level: Not on file  Occupational History   Not on file  Tobacco Use   Smoking status: Never   Smokeless tobacco: Never  Vaping Use   Vaping Use: Never used  Substance and Sexual Activity   Alcohol use: Never    Comment: Rare   Drug use: No   Sexual activity: Not on file    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  Other Topics Concern   Not on file  Social History Narrative   Not on file   Social Determinants of Health   Financial Resource Strain: Not on file  Food Insecurity: Not on file  Transportation Needs: Not on file  Physical Activity: Not on file  Stress: Not on file  Social Connections: Not on file  Intimate Partner Violence: Not on file   Family History  Problem Relation Age of Onset   Heart disease Father 62   Heart disease Sister 49     Review of Systems  All other systems reviewed and are negative.     Objective:   Physical Exam Vitals reviewed.  Constitutional:      General: He is not in acute distress.    Appearance: He is well-developed. He is not diaphoretic.  HENT:     Head: Normocephalic and atraumatic.     Right Ear: External ear normal.     Left Ear: External ear normal.     Nose: Nose normal.     Mouth/Throat:     Pharynx: No oropharyngeal exudate.  Eyes:     General: No scleral icterus.       Right eye: No discharge.        Left eye: No discharge.      Conjunctiva/sclera: Conjunctivae normal.  Pupils: Pupils are equal, round, and reactive to light.  Neck:     Thyroid: No thyromegaly.     Vascular: No JVD.  Cardiovascular:     Rate and Rhythm: Normal rate and regular rhythm.     Heart sounds: No murmur heard.   No friction rub. No gallop.  Pulmonary:     Effort: Pulmonary effort is normal. No respiratory distress.     Breath sounds: Normal breath sounds. No wheezing or rales.  Chest:     Chest wall: No tenderness.  Abdominal:     General: Bowel sounds are normal. There is no distension.     Palpations: Abdomen is soft. There is no mass.     Tenderness: There is no abdominal tenderness. There is no guarding or rebound.  Musculoskeletal:        General: No tenderness. Normal range of motion.     Cervical back: Normal range of motion and neck supple.  Lymphadenopathy:     Cervical: No cervical adenopathy.  Skin:    General: Skin is warm.     Coloration: Skin is not pale.     Findings: No erythema or rash.  Neurological:     Mental Status: He is alert and oriented to person, place, and time.     Cranial Nerves: No cranial nerve deficit.     Motor: No abnormal muscle tone.     Coordination: Coordination normal.     Deep Tendon Reflexes: Reflexes are normal and symmetric.  Psychiatric:        Behavior: Behavior normal.        Thought Content: Thought content normal.        Judgment: Judgment normal.          Assessment & Plan:  General medical exam - Plan: PSA  White coat syndrome without diagnosis of hypertension Obviously heart rate and blood pressure elevated however this is whitecoat syndrome.  The patient continue to check his blood pressure at home and notify us of the values.  As long as it is less than 140/90, he does not require medication to treat this.  I reviewed his CBC CMP lipid panel all of which were outstanding.  Regular anticipatory guidance was provided.  I will check a PSA today to screen for prostate  cancer.  Also recommended a flu shot, COVID vaccination, shingles, and tetanus shot.  Patient will consider these but at the present time he politely defers them.

## 2021-12-26 LAB — PSA: PSA: 0.84 ng/mL (ref <=4.00)

## 2021-12-27 ENCOUNTER — Telehealth: Payer: Self-pay

## 2021-12-27 NOTE — Telephone Encounter (Signed)
-----   Message from Donita Brooks, MD sent at 12/27/2021  6:56 AM EST ----- Prostate cancer test is perfect

## 2021-12-27 NOTE — Telephone Encounter (Signed)
Left message for patient to call back regarding results.

## 2023-07-18 ENCOUNTER — Telehealth: Payer: Self-pay | Admitting: Family Medicine

## 2023-07-18 NOTE — Telephone Encounter (Signed)
Patient unsure if he has COVID; dry cough (sometimes brings up mucous), sore throat. Hoarse this morning.   Nyquil helping some.   Advised patient to take an at-home COVID test or go to the nearest UC. Patient unsure of which he'll do; stated he'll call back if he decides to take an at-home test.

## 2023-10-24 ENCOUNTER — Encounter: Payer: Self-pay | Admitting: Family Medicine

## 2023-11-20 ENCOUNTER — Other Ambulatory Visit: Payer: 59

## 2023-11-24 ENCOUNTER — Encounter: Payer: 59 | Admitting: Family Medicine

## 2023-12-19 ENCOUNTER — Other Ambulatory Visit: Payer: 59

## 2023-12-19 DIAGNOSIS — R030 Elevated blood-pressure reading, without diagnosis of hypertension: Secondary | ICD-10-CM

## 2023-12-19 DIAGNOSIS — I493 Ventricular premature depolarization: Secondary | ICD-10-CM

## 2023-12-19 DIAGNOSIS — Z125 Encounter for screening for malignant neoplasm of prostate: Secondary | ICD-10-CM

## 2023-12-19 DIAGNOSIS — Z13220 Encounter for screening for lipoid disorders: Secondary | ICD-10-CM

## 2023-12-20 LAB — CBC WITH DIFFERENTIAL/PLATELET
Absolute Lymphocytes: 1234 {cells}/uL (ref 850–3900)
Absolute Monocytes: 344 {cells}/uL (ref 200–950)
Basophils Absolute: 29 {cells}/uL (ref 0–200)
Basophils Relative: 0.7 %
Eosinophils Absolute: 193 {cells}/uL (ref 15–500)
Eosinophils Relative: 4.7 %
HCT: 48.3 % (ref 38.5–50.0)
Hemoglobin: 15.9 g/dL (ref 13.2–17.1)
MCH: 30.5 pg (ref 27.0–33.0)
MCHC: 32.9 g/dL (ref 32.0–36.0)
MCV: 92.5 fL (ref 80.0–100.0)
MPV: 9.4 fL (ref 7.5–12.5)
Monocytes Relative: 8.4 %
Neutro Abs: 2300 {cells}/uL (ref 1500–7800)
Neutrophils Relative %: 56.1 %
Platelets: 199 10*3/uL (ref 140–400)
RBC: 5.22 10*6/uL (ref 4.20–5.80)
RDW: 12.2 % (ref 11.0–15.0)
Total Lymphocyte: 30.1 %
WBC: 4.1 10*3/uL (ref 3.8–10.8)

## 2023-12-20 LAB — COMPLETE METABOLIC PANEL WITH GFR
AG Ratio: 1.7 (calc) (ref 1.0–2.5)
ALT: 25 U/L (ref 9–46)
AST: 20 U/L (ref 10–35)
Albumin: 4.5 g/dL (ref 3.6–5.1)
Alkaline phosphatase (APISO): 41 U/L (ref 35–144)
BUN: 25 mg/dL (ref 7–25)
CO2: 29 mmol/L (ref 20–32)
Calcium: 9.8 mg/dL (ref 8.6–10.3)
Chloride: 103 mmol/L (ref 98–110)
Creat: 1.09 mg/dL (ref 0.70–1.30)
Globulin: 2.7 g/dL (ref 1.9–3.7)
Glucose, Bld: 87 mg/dL (ref 65–99)
Potassium: 4.2 mmol/L (ref 3.5–5.3)
Sodium: 142 mmol/L (ref 135–146)
Total Bilirubin: 1 mg/dL (ref 0.2–1.2)
Total Protein: 7.2 g/dL (ref 6.1–8.1)
eGFR: 81 mL/min/{1.73_m2} (ref >=60)

## 2023-12-20 LAB — LIPID PANEL
Cholesterol: 167 mg/dL (ref <200)
HDL: 50 mg/dL (ref >=40)
LDL Cholesterol (Calc): 99 mg/dL
Non-HDL Cholesterol (Calc): 117 mg/dL (ref <130)
Total CHOL/HDL Ratio: 3.3 (calc) (ref <5.0)
Triglycerides: 90 mg/dL (ref <150)

## 2023-12-20 LAB — PSA: PSA: 0.87 ng/mL (ref <=4.00)

## 2023-12-22 ENCOUNTER — Encounter: Payer: Self-pay | Admitting: Family Medicine

## 2023-12-22 ENCOUNTER — Ambulatory Visit: Payer: 59 | Admitting: Family Medicine

## 2023-12-22 VITALS — BP 134/86 | HR 111 | Temp 98.3°F | Ht 64.0 in | Wt 152.0 lb

## 2023-12-22 DIAGNOSIS — Z0000 Encounter for general adult medical examination without abnormal findings: Secondary | ICD-10-CM | POA: Diagnosis not present

## 2023-12-22 MED ORDER — JUBLIA 10 % EX SOLN: 1.0000 | Freq: Every day | CUTANEOUS | 3 refills | Status: AC

## 2023-12-22 NOTE — Progress Notes (Signed)
Subjective:    Patient ID: Michael Zavala, male    DOB: 17-Aug-1971, 52 y.o.   MRN: [id number]  HPI Patient is a very pleasant 52 year old Caucasian gentleman who is here today for complete physical exam. His colonoscopy was in 2020.  He is not due for another colonoscopy until 2030.  Patient concerns.  Overall he is doing very well.  Specifically he denies any chest.  He does have onychomycosis on his toenails.  He does not want to take any oral medication but he is interested in [place].  Otherwise he is doing fine.  He declines a flu shot, COVID shot, shingles shot, and a tetanus shot. Lab on 12/19/2023  Component Date Value Ref Range Status   WBC 12/19/2023 4.1  3.8 - 10.8 Thousand/uL Final   RBC 12/19/2023 5.22  4.20 - 5.80 Million/uL Final   Hemoglobin 12/19/2023 15.9  13.2 - 17.1 g/dL Final   HCT 40/98/1191 48.3  38.5 - 50.0 % Final   MCV 12/19/2023 92.5  80.0 - 100.0 fL Final   MCH 12/19/2023 30.5  27.0 - 33.0 pg Final   MCHC 12/19/2023 32.9  32.0 - 36.0 g/dL Final   Comment: For adults, a slight decrease in the calculated MCHC value (in the range of 30 to 32 g/dL) is most likely not clinically significant; however, it should be interpreted with caution in correlation with other red cell parameters and the patient's clinical condition.    RDW 12/19/2023 12.2  11.0 - 15.0 % Final   Platelets 12/19/2023 199  140 - 400 Thousand/uL Final   MPV 12/19/2023 9.4  7.5 - 12.5 fL Final   Neutro Abs 12/19/2023 2,300  1,500 - 7,800 cells/uL Final   Absolute Lymphocytes 12/19/2023 1,234  850 - 3,900 cells/uL Final   Absolute Monocytes 12/19/2023 344  200 - 950 cells/uL Final   Eosinophils Absolute 12/19/2023 193  15 - 500 cells/uL Final   Basophils Absolute 12/19/2023 29  0 - 200 cells/uL Final   Neutrophils Relative % 12/19/2023 56.1  % Final   Total Lymphocyte 12/19/2023 30.1  % Final   Monocytes Relative 12/19/2023 8.4  % Final   Eosinophils Relative 12/19/2023 4.7  % Final   Basophils  Relative 12/19/2023 0.7  % Final   Glucose, Bld 12/19/2023 87  65 - 99 mg/dL Final   Comment: .            Fasting reference interval .    BUN 12/19/2023 25  7 - 25 mg/dL Final   Creat 47/82/9562 1.09  0.70 - 1.30 mg/dL Final   eGFR 13/07/6577 81  > OR = 60 mL/min/1.18m2 Final   BUN/Creatinine Ratio 12/19/2023 SEE NOTE:  6 - 22 (calc) Final   Comment:    Not Reported: BUN and Creatinine are within    reference range. .    Sodium 12/19/2023 142  135 - 146 mmol/L Final   Potassium 12/19/2023 4.2  3.5 - 5.3 mmol/L Final   Chloride 12/19/2023 103  98 - 110 mmol/L Final   CO2 12/19/2023 29  20 - 32 mmol/L Final   Calcium 12/19/2023 9.8  8.6 - 10.3 mg/dL Final   Total Protein 46/96/2952 7.2  6.1 - 8.1 g/dL Final   Albumin 84/13/2440 4.5  3.6 - 5.1 g/dL Final   Globulin 10/26/2535 2.7  1.9 - 3.7 g/dL (calc) Final   AG Ratio 12/19/2023 1.7  1.0 - 2.5 (calc) Final   Total Bilirubin 12/19/2023 1.0  0.2 - 1.2 mg/dL Final  Alkaline phosphatase (APISO) 12/19/2023 41  35 - 144 U/L Final   AST 12/19/2023 20  10 - 35 U/L Final   ALT 12/19/2023 25  9 - 46 U/L Final   Cholesterol 12/19/2023 167  <200 mg/dL Final   HDL 16/09/9603 50  > OR = 40 mg/dL Final   Triglycerides 54/08/8118 90  <150 mg/dL Final   LDL Cholesterol (Calc) 12/19/2023 99  mg/dL (calc) Final   Comment: Reference range: <100 . Desirable range <100 mg/dL for primary prevention;   <70 mg/dL for patients with CHD or diabetic patients  with > or = 2 CHD risk factors. Marland Kitchen LDL-C is now calculated using the Martin-Hopkins  calculation, which is a validated novel method providing  better accuracy than the Friedewald equation in the  estimation of LDL-C.  Horald Pollen et al. Lenox Ahr. 1478;295(62): 2061-2068  (http://education.[facility].com/faq/FAQ164)    Total CHOL/HDL Ratio 12/19/2023 3.3  <1.3 (calc) Final   Non-HDL Cholesterol (Calc) 12/19/2023 117  <130 mg/dL (calc) Final   Comment: For patients with diabetes plus 1 major  ASCVD risk  factor, treating to a non-HDL-C goal of <100 mg/dL  (LDL-C of <08 mg/dL) is considered a therapeutic  option.    PSA 12/19/2023 0.87  < OR = 4.00 ng/mL Final   Comment: The total PSA value from this assay system is  standardized against the WHO standard. The test  result will be approximately 20% lower when compared  to the equimolar-standardized total PSA (Beckman  Coulter). Comparison of serial PSA results should be  interpreted with this fact in mind. . This test was performed using the Siemens  chemiluminescent method. Values obtained from  different assay methods cannot be used interchangeably. PSA levels, regardless of value, should not be interpreted as absolute evidence of the presence or absence of disease.      Past Medical History:  Diagnosis Date   PVC (premature ventricular contraction)    White coat syndrome without diagnosis of hypertension    Social History   Socioeconomic History   Marital status: Divorced    Spouse name: Not on file   Number of children: Not on file   Years of education: Not on file   Highest education level: Not on file  Occupational History   Not on file  Tobacco Use   Smoking status: Never   Smokeless tobacco: Never  Vaping Use   Vaping status: Never Used  Substance and Sexual Activity   Alcohol use: Never    Comment: Rare   Drug use: No   Sexual activity: Not on file    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  Other Topics Concern   Not on file  Social History Narrative   Not on file   Social Drivers of Health   Financial Resource Strain: Not on file  Food Insecurity: Not on file  Transportation Needs: Not on file  Physical Activity: Not on file  Stress: Not on file  Social Connections: Not on file  Intimate Partner Violence: Not on file   Family History  Problem Relation Age of Onset   Heart disease Father 71   Heart disease Sister 26     Review of Systems  All other systems reviewed and are  negative.      Objective:   Physical Exam Vitals reviewed.  Constitutional:      General: He is not in acute distress.    Appearance: He is well-developed. He is not diaphoretic.  HENT:     Head:  Normocephalic and atraumatic.     Right Ear: External ear normal.     Left Ear: External ear normal.     Nose: Nose normal.     Mouth/Throat:     Pharynx: No oropharyngeal exudate.  Eyes:     General: No scleral icterus.       Right eye: No discharge.        Left eye: No discharge.     Conjunctiva/sclera: Conjunctivae normal.     Pupils: Pupils are equal, round, and reactive to light.  Neck:     Thyroid: No thyromegaly.     Vascular: No JVD.  Cardiovascular:     Rate and Rhythm: Normal rate and regular rhythm.     Heart sounds: No murmur heard.    No friction rub. No gallop.  Pulmonary:     Effort: Pulmonary effort is normal. No respiratory distress.     Breath sounds: Normal breath sounds. No wheezing or rales.  Chest:     Chest wall: No tenderness.  Abdominal:     General: Bowel sounds are normal. There is no distension.     Palpations: Abdomen is soft. There is no mass.     Tenderness: There is no abdominal tenderness. There is no guarding or rebound.  Musculoskeletal:        General: No tenderness. Normal range of motion.     Cervical back: Normal range of motion and neck supple.  Lymphadenopathy:     Cervical: No cervical adenopathy.  Skin:    General: Skin is warm.     Coloration: Skin is not pale.     Findings: No erythema or rash.  Neurological:     Mental Status: He is alert and oriented to person, place, and time.     Cranial Nerves: No cranial nerve deficit.     Motor: No abnormal muscle tone.     Coordination: Coordination normal.     Deep Tendon Reflexes: Reflexes are normal and symmetric.  Psychiatric:        Behavior: Behavior normal.        Thought Content: Thought content normal.        Judgment: Judgment normal.           Assessment & Plan:   General medical exam Patient does have a family history of heart disease.  His father had a stent in his heart in his 18s.  Patient's cholesterol is outstanding.  We discussed a coronary artery calcium score but the patient declines this at the present time.  PSA and colonoscopy are up-to-date.  Patient politely declines vaccinations today.  I did prescribe Jublia.  Patient can apply to the toenail daily for up to 48 weeks

## 2024-01-06 ENCOUNTER — Telehealth: Payer: Self-pay

## 2024-01-06 NOTE — Telephone Encounter (Signed)
 Fax received from pharmacy requesting an alternative to Jublia 10% topical solution. Insurance prefers Itraconazole or Terbinafine. Can we send in a new Rx?

## 2024-04-20 ENCOUNTER — Other Ambulatory Visit: Payer: Self-pay

## 2024-04-20 ENCOUNTER — Encounter (HOSPITAL_BASED_OUTPATIENT_CLINIC_OR_DEPARTMENT_OTHER): Payer: Self-pay | Admitting: Emergency Medicine

## 2024-04-20 ENCOUNTER — Emergency Department (HOSPITAL_BASED_OUTPATIENT_CLINIC_OR_DEPARTMENT_OTHER)
Admission: EM | Admit: 2024-04-20 | Discharge: 2024-04-20 | Disposition: A | Discharge status: Home / Self Care | Attending: Emergency Medicine | Admitting: Emergency Medicine

## 2024-04-20 DIAGNOSIS — R42 Dizziness and giddiness: Secondary | ICD-10-CM | POA: Insufficient documentation

## 2024-04-20 LAB — URINALYSIS, ROUTINE W REFLEX MICROSCOPIC
Bilirubin Urine: NEGATIVE
Glucose, UA: NEGATIVE mg/dL
Hgb urine dipstick: NEGATIVE
Ketones, ur: NEGATIVE mg/dL
Leukocytes,Ua: NEGATIVE
Nitrite: NEGATIVE
Protein, ur: NEGATIVE mg/dL
Specific Gravity, Urine: 1.01 (ref 1.005–1.030)
pH: 6.5 (ref 5.0–8.0)

## 2024-04-20 LAB — BASIC METABOLIC PANEL WITH GFR
Anion gap: 12 (ref 5–15)
BUN: 17 mg/dL (ref 6–20)
CO2: 25 mmol/L (ref 22–32)
Calcium: 9.8 mg/dL (ref 8.9–10.3)
Chloride: 103 mmol/L (ref 98–111)
Creatinine, Ser: 0.95 mg/dL (ref 0.61–1.24)
GFR, Estimated: >60 mL/min (ref >60)
Glucose, Bld: 93 mg/dL (ref 70–99)
Potassium: 3.9 mmol/L (ref 3.5–5.1)
Sodium: 140 mmol/L (ref 135–145)

## 2024-04-20 LAB — CBC
HCT: 43.6 % (ref 39.0–52.0)
Hemoglobin: 15.1 g/dL (ref 13.0–17.0)
MCH: 31.1 pg (ref 26.0–34.0)
MCHC: 34.6 g/dL (ref 30.0–36.0)
MCV: 89.9 fL (ref 80.0–100.0)
Platelets: 170 10*3/uL (ref 150–400)
RBC: 4.85 MIL/uL (ref 4.22–5.81)
RDW: 13 % (ref 11.5–15.5)
WBC: 6.5 10*3/uL (ref 4.0–10.5)
nRBC: 0 % (ref 0.0–0.2)

## 2024-04-20 LAB — TSH: TSH: 4.47 u[IU]/mL (ref 0.350–4.500)

## 2024-04-20 MED ORDER — MECLIZINE HCL 25 MG PO TABS: 25.0000 mg | ORAL_TABLET | Freq: Three times a day (TID) | ORAL | 0 refills | Status: AC | PRN

## 2024-04-20 NOTE — ED Provider Notes (Signed)
 [facility]MENT AT [facility] Provider Note  CSN: [id number] Arrival date & time: 04/20/24 1423  Chief Complaint(s) Weakness  HPI Michael Zavala is a 53 y.o. male who is here today for intermittent episodes of weakness that have been occurring for the last couple of months.  Patient describes intermittent episodes or sometimes he feels little bit off balance, last for approximately 1 to 2 seconds before resolving.  He also says that sometimes his legs will feel very tired.  Patient says this does not happen all the time, and in fact a couple of days ago he was able to do a "brisk 50-minute walk without any difficulty."  He says that sometimes he just feels very fatigued, and has no energy.  No fever, no chills   Past Medical History Past Medical History:  Diagnosis Date   PVC (premature ventricular contraction)    White coat syndrome without diagnosis of hypertension    Patient Active Problem List   Diagnosis Date Noted   White coat syndrome without diagnosis of hypertension    Allergic rhinitis 05/31/2016   Deviated septum 05/31/2016   PVC (premature ventricular contraction)    Home Medication(s) Prior to Admission medications   Medication Sig Start Date End Date Taking? Authorizing Provider  meclizine  (ANTIVERT ) 25 MG tablet Take 1 tablet (25 mg total) by mouth 3 (three) times daily as needed for dizziness. 04/20/24  Yes Afton Horse T, DO  Efinaconazole  (JUBLIA ) 10 % SOLN Apply 1 Application topically daily. 12/22/23   Austine Lefort, MD  HYDROcodone -acetaminophen  (NORCO/VICODIN) 5-325 MG tablet Take 1 tablet by mouth every 6 (six) hours as needed for moderate pain. Patient not taking: Reported on 12/22/2023 04/30/21   Jacolyn Matar, MD  Multiple Vitamins-Minerals (MULTIVITAMIN WITH MINERALS) tablet Take 1 tablet by mouth daily. Patient not taking: Reported on 12/22/2023    [provider]  Omega-3 Fatty Acids (FISH OIL) 1200 MG CAPS Take  1,200 mg by mouth daily. 2 tab po bid Patient not taking: Reported on 12/22/2023    [provider]  terbinafine  (LAMISIL ) 250 MG tablet Take 1 tablet (250 mg total) by mouth daily. Patient not taking: Reported on 12/22/2023 12/25/21   Austine Lefort, MD                                                                                                                                    Past Surgical History Past Surgical History:  Procedure Laterality Date   INGUINAL HERNIA REPAIR Bilateral 04/30/2021   Procedure: BILATERAL LAPAROSCOPIC INGUINAL HERNIA REPAIRS;  Surgeon: Jacolyn Matar, MD;  Location: [facility];  Service: General;  Laterality: Bilateral;   INSERTION OF MESH Bilateral 04/30/2021   Procedure: INSERTION OF MESH;  Surgeon: Jacolyn Matar, MD;  Location: [facility];  Service: General;  Laterality: Bilateral;   NO PAST SURGERIES     Family History Family History  Problem Relation Age of Onset  Heart disease Father 20   Heart disease Sister 17    Social History Social History   Tobacco Use   Smoking status: Never   Smokeless tobacco: Never  Vaping Use   Vaping status: Never Used  Substance Use Topics   Alcohol use: Never    Comment: Rare   Drug use: No   Allergies Patient has no known allergies.  Review of Systems Review of Systems  Physical Exam Vital Signs  I have reviewed the triage vital signs BP (!) 151/89   Pulse 74   Temp 98.9 F (37.2 C) (Oral)   Resp 18   SpO2 97%   Physical Exam Vitals reviewed.  Constitutional:      Appearance: He is not toxic-appearing.  HENT:     Right Ear: Tympanic membrane normal.     Left Ear: Tympanic membrane normal.  Eyes:     Pupils: Pupils are equal, round, and reactive to light.  Cardiovascular:     Rate and Rhythm: Normal rate.     Pulses: Normal pulses.  Pulmonary:     Effort: Pulmonary effort is normal.  Musculoskeletal:     Cervical back: Normal range of motion.  Neurological:     Mental Status:  He is alert.     ED Results and Treatments Labs (all labs ordered are listed, but only abnormal results are displayed) Labs Reviewed  BASIC METABOLIC PANEL WITH GFR  CBC  URINALYSIS, ROUTINE W REFLEX MICROSCOPIC  TSH  LYME DISEASE SEROLOGY W/REFLEX  CBG MONITORING, ED                                                                                                                          Radiology No results found.  Pertinent labs & imaging results that were available during my care of the patient were reviewed by me and considered in my medical decision making (see MDM for details).  Medications Ordered in ED Medications - No data to display                                                                                                                                   Procedures Procedures  (including critical care time)  Medical Decision Making / ED Course   This patient presents to the ED for concern of intermittent fatigue, intermittent dizziness, this involves an extensive number of treatment options, and is a complaint  that carries with it a high risk of complications and morbidity.  The differential diagnosis includes vertigo, anemia, hypothyroidism, less likely ACS, less likely stable angina, considered peripheral ischemia.  MDM: Overall patient has a reassuring physical exam.  He has no neurological deficits on my exam.  Has been ambulatory, and recently has gone on extended walks without any difficulty.  I do not have a clear diagnosis for the patient's intermittent episodes of fatigue.  Dependent on TSH.  Patient's blood work thus far normal, no anemia, normal renal function.  He will have to follow-up with his PCP regarding fatigue.  Regarding patient's intermittent episodes of dizziness, this does sound like peripheral vertigo.  Will provide meclizine .   Patient will follow-up with his chart to MyChart and follow-up with his PCP.  Additional history  obtained: -Additional history obtained from mother at bedside -External records from outside source obtained and reviewed including: Chart review including previous notes, labs, imaging, consultation notes   Lab Tests: -I ordered, reviewed, and interpreted labs.   The pertinent results include:   Labs Reviewed  BASIC METABOLIC PANEL WITH GFR  CBC  URINALYSIS, ROUTINE W REFLEX MICROSCOPIC  TSH  LYME DISEASE SEROLOGY W/REFLEX  CBG MONITORING, ED      EKG my independent review of the patient's EKG shows no ST segment depressions or elevations, no T wave inversions, no evidence of acute ischemia.  EKG Interpretation Date/Time:  Tuesday April 20 2024 14:36:42 EDT Ventricular Rate:  91 PR Interval:  114 QRS Duration:  86 QT Interval:  342 QTC Calculation: 420 R Axis:   40  Text Interpretation: Normal sinus rhythm Cannot rule out Anterior infarct , age undetermined Abnormal ECG When compared with ECG of 25-Apr-2021 11:36, No significant change was found Confirmed by Afton Horse [phone]) on 04/20/2024 8:37:31 PM        Medicines ordered and prescription drug management: Meds ordered this encounter  Medications   meclizine  (ANTIVERT ) 25 MG tablet    Sig: Take 1 tablet (25 mg total) by mouth 3 (three) times daily as needed for dizziness.    Dispense:  30 tablet    Refill:  0    -I have reviewed the patients home medicines and have made adjustments as needed  Cardiac Monitoring: The patient was maintained on a cardiac monitor.  I personally viewed and interpreted the cardiac monitored which showed an underlying rhythm of: Normal sinus rhythm    Reevaluation: After the interventions noted above, I reevaluated the patient and found that they have :improved  Co morbidities that complicate the patient evaluation  Past Medical History:  Diagnosis Date   PVC (premature ventricular contraction)    White coat syndrome without diagnosis of hypertension       Dispostion: I  considered admission for this patient, however the patient is appropriate for outpatient follow-up.     Final Clinical Impression(s) / ED Diagnoses Final diagnoses:  Vertigo     @PCDICTATION @    Rayshard, Schirtzinger, DO 04/20/24 2214

## 2024-04-20 NOTE — ED Triage Notes (Signed)
 Bilateral leg weakness Easily fatigued Some off balance  X "few months"

## 2024-04-20 NOTE — Discharge Instructions (Addendum)
 While you were in the emergency room, had blood work done that was normal.  At this time, I do not of a clear cause for the fatigue that you are feeling, but I am reassured by your heart workup, and your blood work.  Regarding the episodes of dizziness and unsteadiness that you get, this can sometimes be caused by vertigo.  I have sent your prescription for a medicine called meclizine .  You can take that up to 3 times per day.  You can check MyChart to see your thyroid  function and Lyme test.  Please follow-up with your PCP.  Return the emergency room if you develop difficulty walking, or lose consciousness.

## 2024-04-22 LAB — LYME DISEASE SEROLOGY W/REFLEX: Lyme Total Antibody EIA: NEGATIVE

## 2024-04-23 ENCOUNTER — Telehealth: Payer: Self-pay

## 2024-04-23 NOTE — Telephone Encounter (Signed)
 Copied from CRM [id number]. Topic: Clinical - Lab/Test Results >> Apr 23, 2024  1:01 PM [name] wrote: Reason for CRM: Pt got some labs done with in the ER and looking to discuss those results. Pt also wanting to discuss getting his vitamin levels checked and the hospital was to send over some orders. Please follow up and contact patient.

## 2024-04-26 ENCOUNTER — Other Ambulatory Visit: Payer: Self-pay | Admitting: Family Medicine

## 2024-04-26 DIAGNOSIS — B351 Tinea unguium: Secondary | ICD-10-CM

## 2024-06-02 ENCOUNTER — Ambulatory Visit: Admitting: Podiatry

## 2024-06-02 DIAGNOSIS — B351 Tinea unguium: Secondary | ICD-10-CM

## 2024-06-02 NOTE — Progress Notes (Signed)
  Subjective:  Patient ID: Michael Zavala, male    DOB: April 06, 1971,  MRN: [id number]  Chief Complaint  Patient presents with   Nail Problem    Nail fungus    53 y.o. male presents with the above complaint.  Patient presents with bilateral hallux and right second digit thickened onychodystrophy mycotic nail x 3.  Wanted get it evaluated has not seen anyone as prior to seeing me.  Is tried topical medication he would like to discuss other treatment options including laser.  He prefer doing noninvasive.  Does not want to do oral medication   Review of Systems: Negative except as noted in the HPI. Denies N/V/F/Ch.  Past Medical History:  Diagnosis Date   PVC (premature ventricular contraction)    White coat syndrome without diagnosis of hypertension     Current Outpatient Medications:    Efinaconazole  (JUBLIA ) 10 % SOLN, Apply 1 Application topically daily., Disp: 4 mL, Rfl: 3   HYDROcodone -acetaminophen  (NORCO/VICODIN) 5-325 MG tablet, Take 1 tablet by mouth every 6 (six) hours as needed for moderate pain. (Patient not taking: Reported on 12/22/2023), Disp: 15 tablet, Rfl: 0   meclizine  (ANTIVERT ) 25 MG tablet, Take 1 tablet (25 mg total) by mouth 3 (three) times daily as needed for dizziness., Disp: 30 tablet, Rfl: 0   Multiple Vitamins-Minerals (MULTIVITAMIN WITH MINERALS) tablet, Take 1 tablet by mouth daily. (Patient not taking: Reported on 12/22/2023), Disp: , Rfl:    Omega-3 Fatty Acids (FISH OIL) 1200 MG CAPS, Take 1,200 mg by mouth daily. 2 tab po bid (Patient not taking: Reported on 12/22/2023), Disp: , Rfl:    terbinafine  (LAMISIL ) 250 MG tablet, Take 1 tablet (250 mg total) by mouth daily. (Patient not taking: Reported on 12/22/2023), Disp: 30 tablet, Rfl: 2  Social History   Tobacco Use  Smoking Status Never  Smokeless Tobacco Never    No Known Allergies Objective:  There were no vitals filed for this visit. There is no height or weight on file to calculate  BMI. Constitutional Well developed. Well nourished.  Vascular Dorsalis pedis pulses palpable bilaterally. Posterior tibial pulses palpable bilaterally. Capillary refill normal to all digits.  No cyanosis or clubbing noted. Pedal hair growth normal.  Neurologic Normal speech. Oriented to person, place, and time. Epicritic sensation to light touch grossly present bilaterally.  Dermatologic Nails thickened elongated dystrophic mycotic toenails x 3 bilateral hallux and right second digit Skin within normal limits  Orthopedic: Normal joint ROM without pain or crepitus bilaterally. No visible deformities. No bony tenderness.   Radiographs: None Assessment:   1. Nail fungus   2. Onychomycosis due to dermatophyte    Plan:  Patient was evaluated and treated and all questions answered.  Bilateral hallux and second digit onychomycosis -Educated the patient on the etiology of onychomycosis and various treatment options associated with improving the fungal load.  I explained to the patient that there is 3 treatment options available to treat the onychomycosis including topical, p.o., laser treatment.  Patient elected to undergo laser therapy.  Rescheduled to see the laser tech.  I discussed we will take 6-7 sessions to see improvement he states understanding.   No follow-ups on file.

## 2024-08-06 ENCOUNTER — Ambulatory Visit (INDEPENDENT_AMBULATORY_CARE_PROVIDER_SITE_OTHER)

## 2024-08-06 DIAGNOSIS — B351 Tinea unguium: Secondary | ICD-10-CM

## 2024-08-06 NOTE — Patient Instructions (Signed)

## 2024-08-06 NOTE — Progress Notes (Signed)
 Patient presents today for the 1st laser treatment. Diagnosed with mycotic nail infection by Dr. Tobie.   Toenail most affected left 1st and right 1st, 2nd and 4th  All other systems are negative.  Nails were filed thin. Laser therapy was administered to 1st toenails bilaterally as well as the right 2nd & 4th toenails. Patient tolerated the treatment well. All safety precautions were in place.    Follow up in 4 weeks for laser # 2.

## 2024-09-17 ENCOUNTER — Ambulatory Visit (INDEPENDENT_AMBULATORY_CARE_PROVIDER_SITE_OTHER): Payer: Self-pay

## 2024-09-17 DIAGNOSIS — B351 Tinea unguium: Secondary | ICD-10-CM

## 2024-09-17 NOTE — Progress Notes (Signed)
 Patient presents today for the 2nd laser treatment. Diagnosed with mycotic nail infection by Dr. Tobie.   Toenail most affected left 1st, right 1st, 2nd & 4th.  All other systems are negative.  Nails were filed thin. Laser therapy was administered to  left 1st, right 1st, 2nd & 4th toenails and patient tolerated the treatment well. All safety precautions were in place.   Post treatment instructions reviewed and provided to patient. Patient had no questions regarding plan of care.   Follow up in 4 weeks for laser # 3.

## 2024-10-19 ENCOUNTER — Ambulatory Visit (INDEPENDENT_AMBULATORY_CARE_PROVIDER_SITE_OTHER)

## 2024-10-19 DIAGNOSIS — B351 Tinea unguium: Secondary | ICD-10-CM

## 2024-10-19 NOTE — Progress Notes (Signed)
 Patient presents today for the 3rd laser treatment. Diagnosed with mycotic nail infection by Dr. Tobie.   Toenail most affected Toenail most affected left 1st, right 1st, 2nd & 4th.  All other systems are negative.  Nails were filed thin. Laser therapy was administered to left 1st, right 1st, 2nd & 4th toenails and patient tolerated the treatment well. All safety precautions were in place.   Post treatment instructions reviewed and provided to patient. Patient had no questions regarding plan of care.   Follow up in 6 weeks for laser # 4.

## 2024-12-15 ENCOUNTER — Ambulatory Visit

## 2024-12-15 DIAGNOSIS — B351 Tinea unguium: Secondary | ICD-10-CM

## 2024-12-15 NOTE — Progress Notes (Signed)
 Patient presents today for the 4th laser treatment. Diagnosed with mycotic nail infection by Dr. Tobie.   Toenail most affected left 1st, right 1st, 2nd, 4th. Some improvement in ails, bilateral 1st still primarily discolored and thickened.  All other systems are negative.  Nails were filed thin. Laser therapy was administered to affected toenails bilaterally and patient tolerated the treatment well. All safety precautions were in place.   Post treatment instructions reviewed and provided to patient. Patient had no questions regarding plan of care.   Follow up in 6 weeks for laser # 5.

## 2025-01-26 ENCOUNTER — Encounter

## 2025-02-01 ENCOUNTER — Ambulatory Visit (INDEPENDENT_AMBULATORY_CARE_PROVIDER_SITE_OTHER)

## 2025-02-01 DIAGNOSIS — B351 Tinea unguium: Secondary | ICD-10-CM

## 2025-02-01 NOTE — Progress Notes (Unsigned)
 Patient presents today for the 5th laser treatment. Diagnosed with mycotic nail infection by Dr. Tobie.   Toenail most affected Left 1st, Right 1st, 2nd, 4th. Affected nails show improvement.  All other systems are negative.  Nails were filed thin. Laser therapy was administered to Bilateral 1st, Right 2nd and 4th toenails and patient tolerated the treatment well. All safety precautions were in place.   Post treatment instructions reviewed and provided to patient. Patient had no questions regarding plan of care.   Follow up in 8 weeks for laser # 6.

## 2025-03-29 ENCOUNTER — Ambulatory Visit
